# Patient Record
Sex: Male | Born: 1955 | Race: White | Hispanic: No | Smoking: Never smoker
Health system: Southern US, Community
[De-identification: ages and names within clinical notes are randomized; demographics above are authoritative.]

## PROBLEM LIST (undated history)

## (undated) DIAGNOSIS — E7800 Pure hypercholesterolemia, unspecified: Secondary | ICD-10-CM

## (undated) DIAGNOSIS — K5190 Ulcerative colitis, unspecified, without complications: Secondary | ICD-10-CM

## (undated) DIAGNOSIS — R7303 Prediabetes: Secondary | ICD-10-CM

## (undated) DIAGNOSIS — E119 Type 2 diabetes mellitus without complications: Secondary | ICD-10-CM

## (undated) DIAGNOSIS — I10 Essential (primary) hypertension: Secondary | ICD-10-CM

## (undated) HISTORY — DX: Pure hypercholesterolemia, unspecified: E78.00

## (undated) HISTORY — DX: Ulcerative colitis, unspecified, without complications: K51.90

## (undated) HISTORY — PX: MEDIAL PARTIAL KNEE REPLACEMENT: SHX5965

## (undated) HISTORY — DX: Prediabetes: R73.03

---

## 2006-01-30 ENCOUNTER — Ambulatory Visit: Payer: Self-pay | Admitting: Cardiology

## 2006-02-01 ENCOUNTER — Ambulatory Visit: Payer: Self-pay | Admitting: Cardiology

## 2006-02-13 ENCOUNTER — Ambulatory Visit: Payer: Self-pay | Admitting: Cardiology

## 2011-03-23 ENCOUNTER — Ambulatory Visit (HOSPITAL_COMMUNITY)
Admission: RE | Admit: 2011-03-23 | Discharge: 2011-03-23 | Disposition: A | Discharge status: Home / Self Care | Payer: BC Managed Care – PPO | Source: Ambulatory Visit | Attending: Urology | Admitting: Urology

## 2011-03-23 ENCOUNTER — Ambulatory Visit (HOSPITAL_COMMUNITY): Payer: BC Managed Care – PPO

## 2011-03-23 DIAGNOSIS — I10 Essential (primary) hypertension: Secondary | ICD-10-CM | POA: Insufficient documentation

## 2011-03-23 DIAGNOSIS — N201 Calculus of ureter: Secondary | ICD-10-CM | POA: Insufficient documentation

## 2011-03-23 DIAGNOSIS — E7800 Pure hypercholesterolemia, unspecified: Secondary | ICD-10-CM | POA: Insufficient documentation

## 2011-03-23 DIAGNOSIS — Z79899 Other long term (current) drug therapy: Secondary | ICD-10-CM | POA: Insufficient documentation

## 2011-03-23 DIAGNOSIS — R110 Nausea: Secondary | ICD-10-CM | POA: Insufficient documentation

## 2011-03-23 LAB — BASIC METABOLIC PANEL
CO2: 30 mEq/L (ref 19–32)
Chloride: 95 mEq/L — ABNORMAL LOW (ref 96–112)
GFR calc Af Amer: 49 mL/min — ABNORMAL LOW (ref >60)
GFR calc non Af Amer: 40 mL/min — ABNORMAL LOW (ref >60)
Potassium: 4.1 mEq/L (ref 3.5–5.1)
Sodium: 134 mEq/L — ABNORMAL LOW (ref 135–145)

## 2011-03-23 LAB — CBC
Hemoglobin: 13.4 g/dL (ref 13.0–17.0)
MCH: 29 pg (ref 26.0–34.0)
RBC: 4.62 MIL/uL (ref 4.22–5.81)
WBC: 11.2 10*3/uL — ABNORMAL HIGH (ref 4.0–10.5)

## 2011-03-23 LAB — SURGICAL PCR SCREEN: Staphylococcus aureus: NEGATIVE

## 2011-04-12 NOTE — Op Note (Signed)
NAME:  Joseph Chase, LIMB              ACCOUNT NO.:  [id number]  MEDICAL RECORD NO.:  [id number]           PATIENT TYPE:  O  LOCATION:  DAYL                         FACILITY:  [facility]  PHYSICIAN:  Bertram Millard. Jocelyn Nold, M.D.DATE OF BIRTH:  Sep 04, 1956  DATE OF PROCEDURE:  03/23/2011 DATE OF DISCHARGE:                              OPERATIVE REPORT   PREOPERATIVE DIAGNOSIS:  A 12-mm left ureteropelvic junction stone.  POSTOPERATIVE DIAGNOSIS:  A 12-mm left ureteropelvic junction stone.  PROCEDURE:  Cystoscopy, left retrograde ureteral pyelogram, left ureteroscopy, holmium laser fragmentation of left ureteropelvic junction stone, double-J stent placement (24 cm x 6-French contour stent with string)  SURGEON:  Bertram Millard. Elenor Wildes, M.D.  ANESTHESIA:  General with LMA.  COMPLICATIONS:  None.  BRIEF HISTORY:  This is a 55 year old male with recent presentation of left proximal ureteral stone approximately 12 mm in size.  It is radiolucent and lithotripsy is not possible because of this.  Due to the patient's significant pain, he was worked in urgently for ureteroscopy and stone fragmentation.  Risks and complications of the procedure have been discussed with the patient and his wife.  The possibility of double- J stent was also discussed.  He understands these and desires to proceed.  DESCRIPTION OF PROCEDURE:  The patient was identified and marked in the holding area and taken to the operating room after IV antibiotics were administered.  He was administered a general anesthetic using LMA and placed in the dorsal lithotomy position.  Genitalia and perineum were prepped and draped.  Time-out was then performed.  The procedure then commenced.  A 22-French panendoscope was advanced under direct vision through his urethra which was normal.  Prostate was nonobstructive.  Bladder was without lesions.  It was inspected circumferentially.  The left ureteral orifice was cannulated with  an open-ended catheter and retrograde pyelogram was performed.  The retrograde revealed a normal ureter.  There was a filling defect in the proximal ureter which corresponded with the previously mentioned left UPJ stone.  Very little contrast got by this filling defect.  Following retrograde, I advanced the sensor-tip guidewire up the left renal pelvis without knocking the stone up into the kidney.  I then placed the ureteroscope up through the ureter to the stone.  The stone at this point had been dislodged in the renal pelvis.  It was grasped with a nitinol basket and brought back into the proximal ureter and backstop was placed proximal to the stone to prevent further migration up into the pelvis during treatment.  I then fragmented the stone into multiple small fragments quite easily with the with the holmium laser. A large fragment backed up in the kidney which was then treated again with lithotripsy using the holmium laser.  I did not remove any of the fragments, as they were quite small and I thought that they would pass with the assistance of a double-J stent.  There was no ureteral injury seen.  I then removed the ureteroscope and using cystoscopic guidance placed a 6-French x 24 cm contour stent.  The string was left on.  The bladder was drained and the procedure  terminated.  The string was taped to the patient's penis.  The patient was awakened and taken to PACU in stable condition.  He was discharged home on 5 days of Bactrim DS one p.o. b.i.d., Percocet #20, and will follow up with me on the 21st of this month.     Bertram Millard. Fadil Macmaster, M.D.     SMD/MEDQ  D:  03/23/2011  T:  03/23/2011  Job:  [id number]  cc:   Selinda Flavin, MD Fax: [phone]  Electronically Signed by Marcine Matar M.D. on 04/12/2011 12:50:02 PM

## 2011-07-21 IMAGING — CR DG CHEST 2V
2 series · 2 of 2 positions shown · non-contrast
Comparison: None

CLINICAL DATA: Preop left stone.  Hypertension.

CHEST - 2 VIEW

[w chest pa]
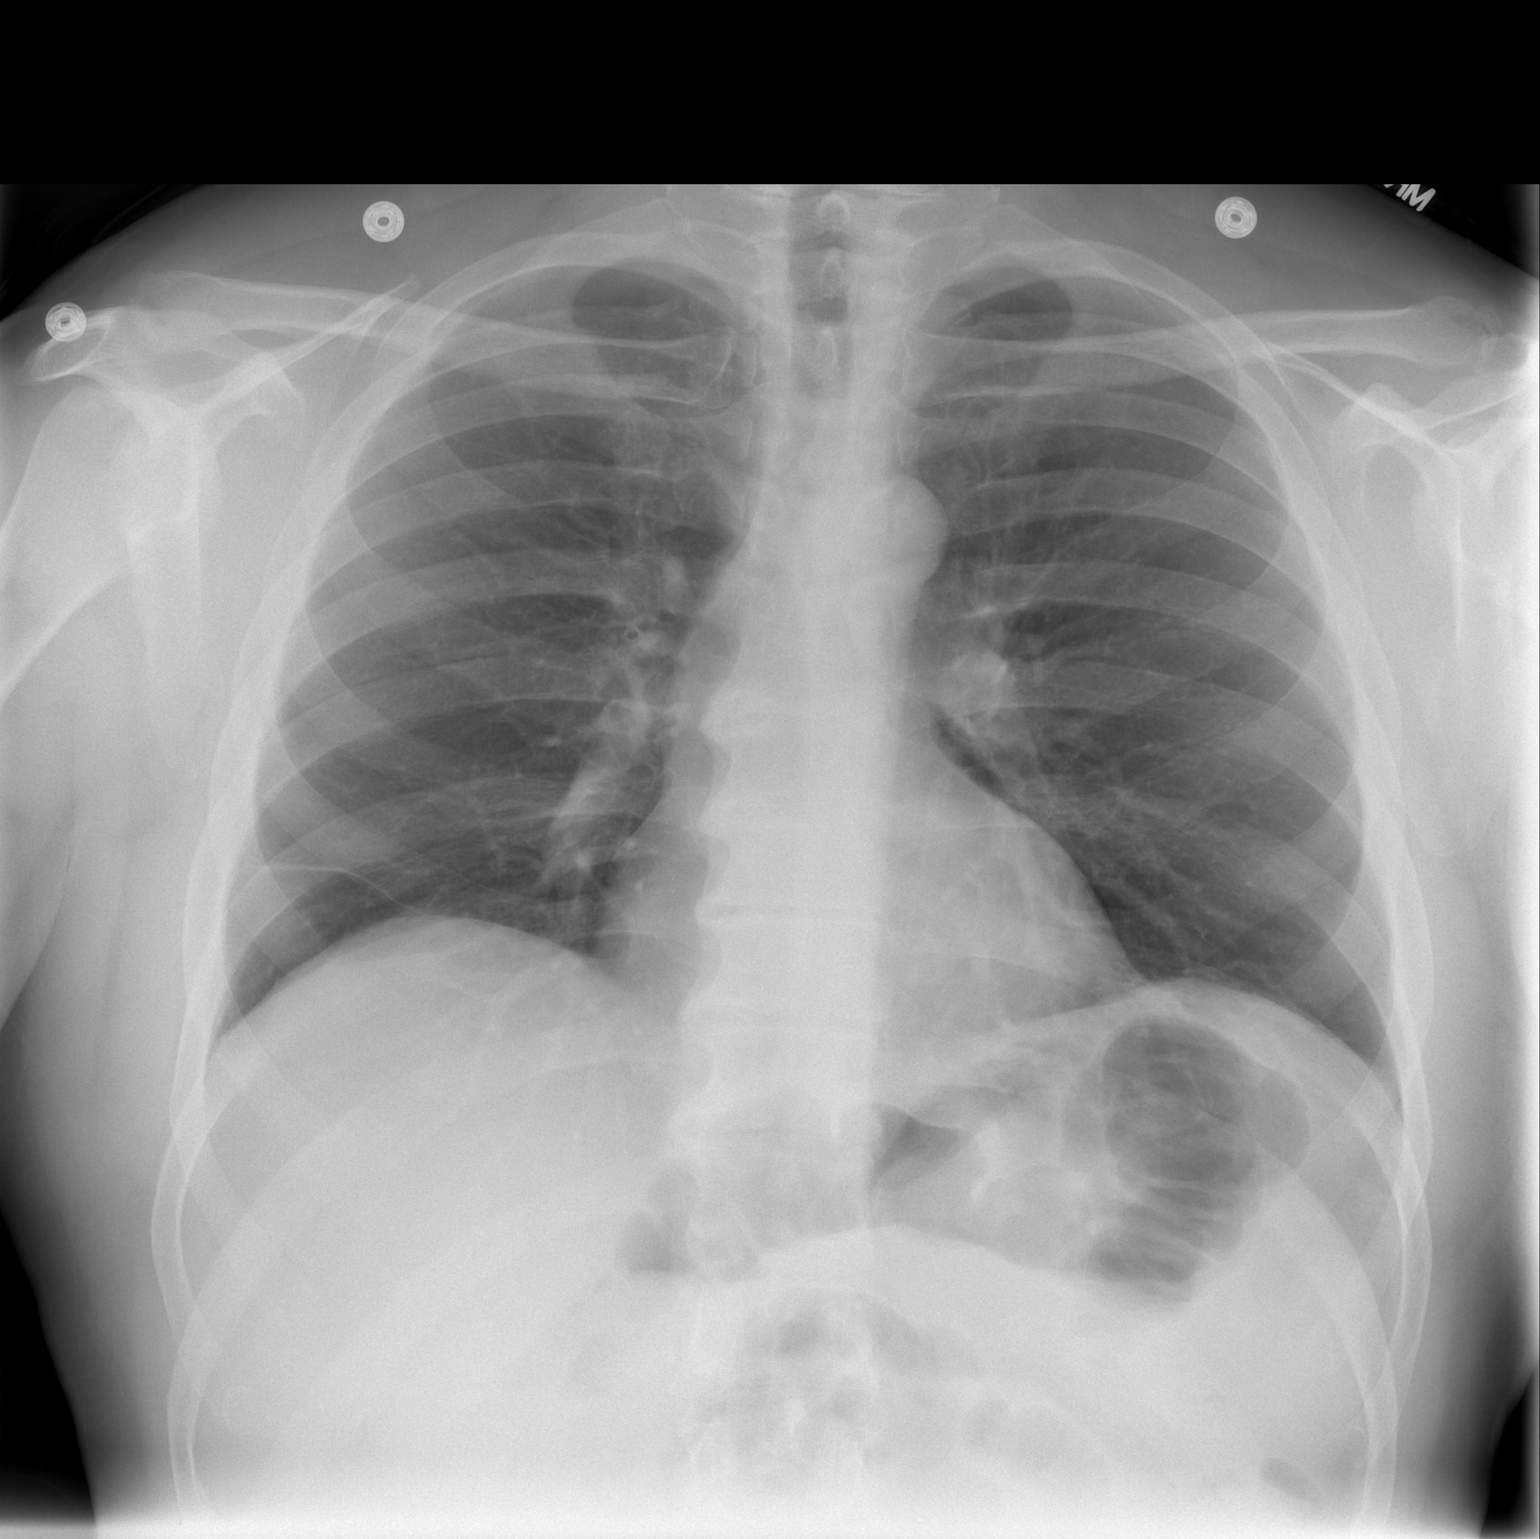

[w chest lat]
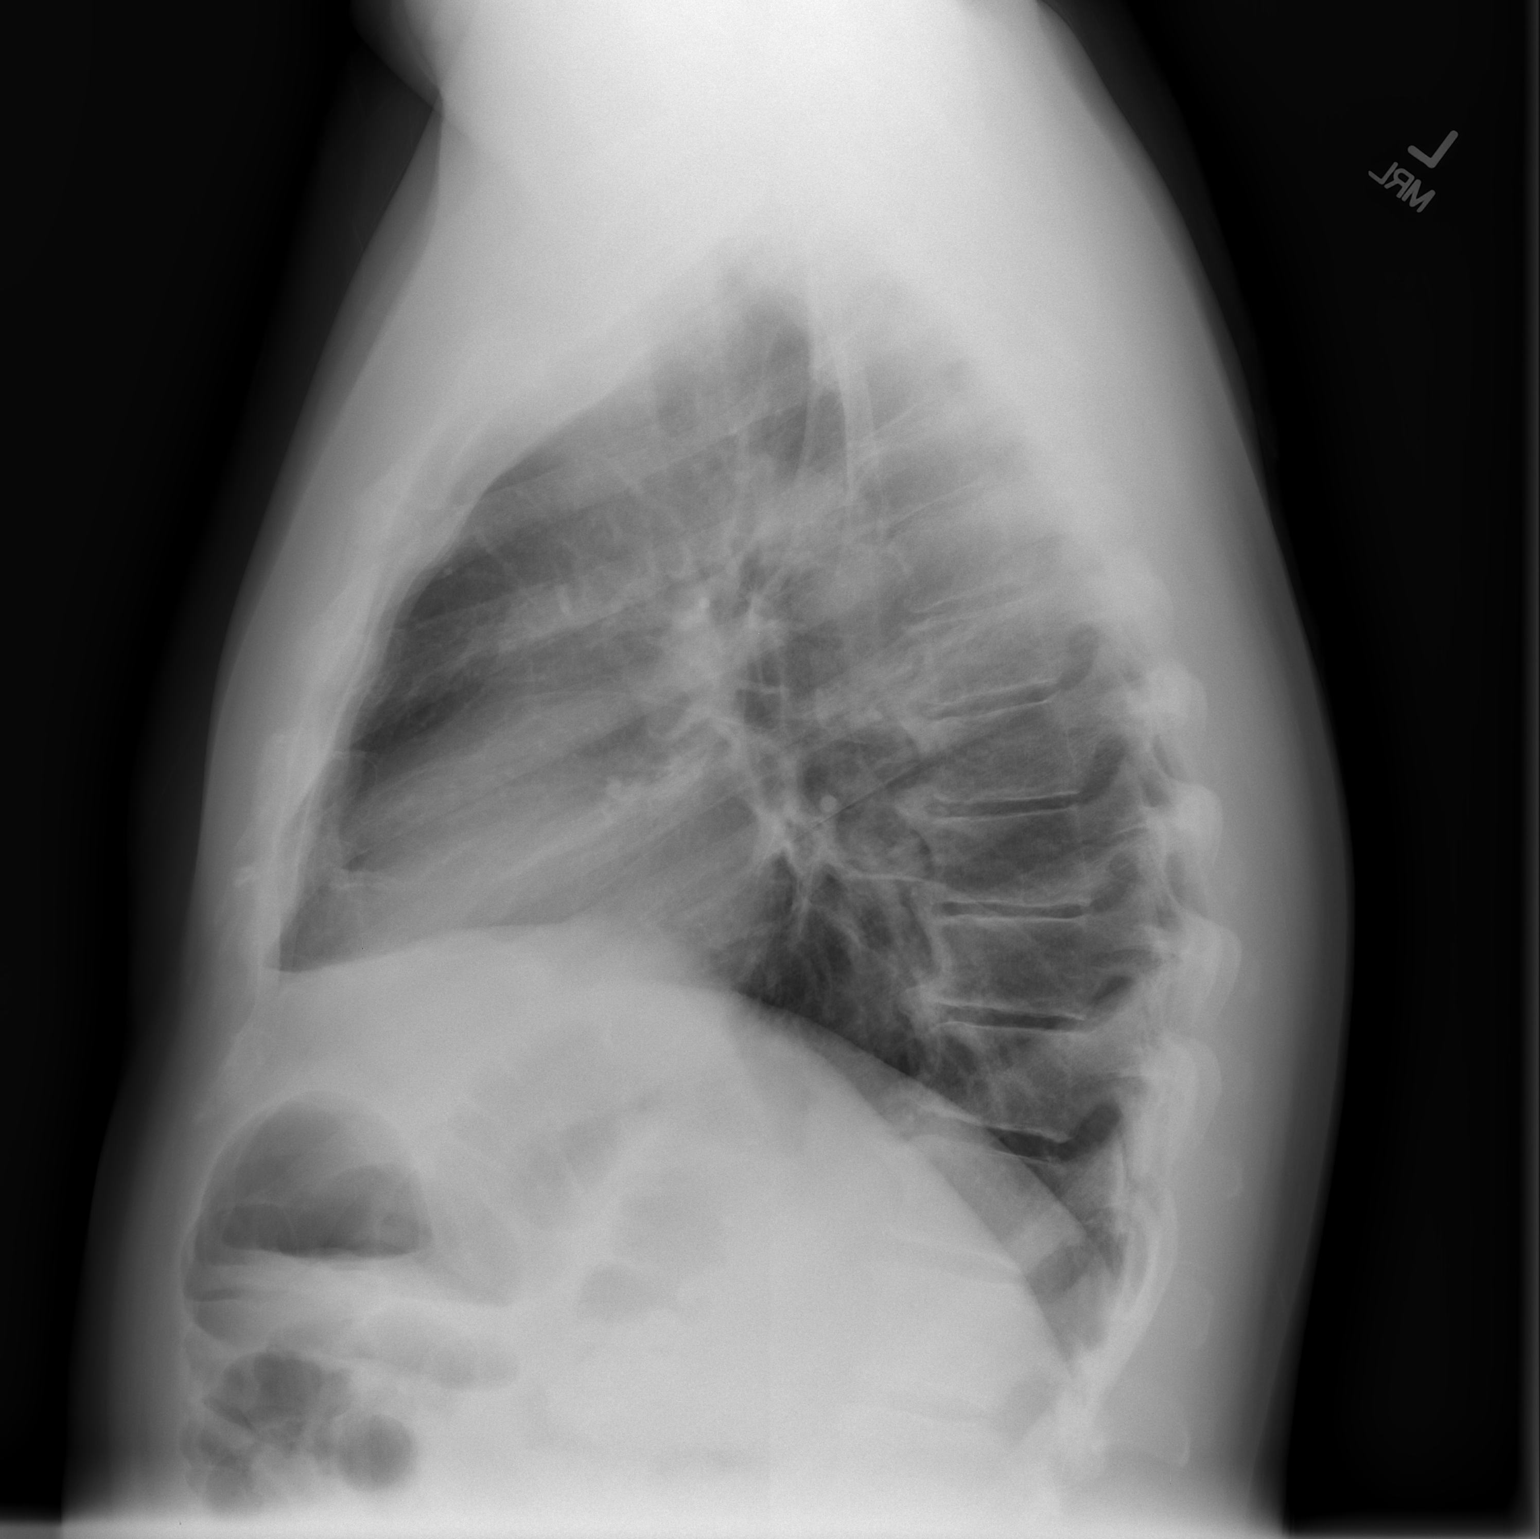

[2 of 2 positions shown; findings below may reference images not displayed]

FINDINGS: Subsegmental atelectasis or scarring in the right lung
base.  Lungs otherwise clear.  Heart is normal size and no
effusions.  Degenerative changes in the thoracic spine.
IMPRESSION: No active disease.  Right basilar scarring or subsegmental
atelectasis.

## 2012-10-01 ENCOUNTER — Ambulatory Visit (INDEPENDENT_AMBULATORY_CARE_PROVIDER_SITE_OTHER): Payer: BC Managed Care – PPO | Admitting: Urology

## 2012-10-01 DIAGNOSIS — N200 Calculus of kidney: Secondary | ICD-10-CM

## 2015-07-24 DIAGNOSIS — Z91030 Bee allergy status: Secondary | ICD-10-CM

## 2015-07-24 DIAGNOSIS — Z91038 Other insect allergy status: Secondary | ICD-10-CM | POA: Insufficient documentation

## 2015-08-25 HISTORY — PX: BREAST MASS EXCISION: SHX1267

## 2015-09-14 ENCOUNTER — Ambulatory Visit (INDEPENDENT_AMBULATORY_CARE_PROVIDER_SITE_OTHER): Payer: 59 | Admitting: Allergy and Immunology

## 2015-09-14 ENCOUNTER — Encounter: Payer: Self-pay | Admitting: Allergy and Immunology

## 2015-09-14 VITALS — BP 134/82 | HR 92 | Temp 98.1°F | Resp 18 | Ht 68.94 in | Wt 191.6 lb

## 2015-09-14 DIAGNOSIS — T6391XD Toxic effect of contact with unspecified venomous animal, accidental (unintentional), subsequent encounter: Secondary | ICD-10-CM

## 2015-09-14 MED ORDER — EPINEPHRINE 0.3 MG/0.3ML IJ SOAJ: 0.3000 mg | Freq: Once | INTRAMUSCULAR | Status: DC

## 2015-09-16 ENCOUNTER — Ambulatory Visit (INDEPENDENT_AMBULATORY_CARE_PROVIDER_SITE_OTHER): Payer: 59

## 2015-09-16 DIAGNOSIS — T63441D Toxic effect of venom of bees, accidental (unintentional), subsequent encounter: Secondary | ICD-10-CM | POA: Diagnosis not present

## 2015-09-19 NOTE — Progress Notes (Signed)
NEW PATIENT NOTE  RE: Bharath Bernstein MRN: [id number] DOB: 11-13-1956 [facility] [place] [address], [place] [address] Date of Office Visit: 09/14/2015  Referring provider: Rory Percy, MD [address] [place], [place] [address]  Subjective:  Nobuo Nunziata is a 59 y.o. male who presents today for Other    HPI:  Hildreth is a 59 year old who has been followed our office with venom allergy, last office visit was 2012.  He is now receiving venom immunotherapy every 8 weeks, tolerating without difficulty and is very pleased.  He reports last staying 2015, a yellowjacket on the hand with mild redness and swelling, but no generalized symptoms.  There also was a wasp sting at side and neck with redness only.  No generalized hives, GI or respiratory symptoms with either sting.  He reports regular time spent at [facility] and is outdoors often and desires to continue allergy injections.  He.  He continues to follow with his other physicians regularly including dermatology with an upcoming visit.  Current medications: 1.  EpiPen as needed. 2.  Allegra 100 mg daily. 3.  Aspirin 81 mg once daily. 4.  Imuran daily. 5.  Neurontin daily. 6.  Glucotrol daily. 7.  Hyzaar daily. 8.  Zocor, Ambien, multivitamin, and tramadol daily.  Medical History: Past Medical History  Diagnosis Date  . Elevated cholesterol   . Borderline diabetes   . Ulcerative colitis [facility])    Surgical History: Past Surgical History  Procedure Laterality Date  . Breast mass excision Right October 12,2016    Per patient, non malignant  . Medial partial knee replacement Right 6 yrs ago   Family History: Family History  Problem Relation Age of Onset  . Alzheimer's disease Mother   . Heart attack Father   . Stroke Father   . Allergic rhinitis Neg Hx   . Angioedema Neg Hx   . Asthma Neg Hx   . Eczema Neg Hx   . Immunodeficiency Neg Hx   . Urticaria  Neg Hx    Social History: Social History  . Marital Status: Married    Spouse Name: N/A  . Number of Children: N/A  . Years of Education: N/A   Occupational History  . Not on file.   Social History Main Topics  . Smoking status: Never Smoker   . Smokeless tobacco: Never Used  . Alcohol Use: Not on file  . Drug Use: Not on file  . Sexual Activity: Not on file   Social History Narrative  .  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     Drug Allergies: Allergies  Allergen Reactions  . Venomil Mixed Vespid [Mixed Vespid Venom]   . Venomil Wasp Venom [Wasp Venom]   . Penicillins Rash    Environmental History: Judge lives in a 61 year old house 26 years wood floors, central air and heat with one dog sleeping on a stuffed mattress non-feather pillow and comforter with secondary smoke exposure.  Review of Systems  Constitutional: Negative for fever, weight loss and malaise/fatigue.  HENT: Positive for congestion. Negative for ear pain, hearing loss, nosebleeds and sore throat.   Eyes: Negative for discharge and redness.  Respiratory: Negative for shortness of breath.        Denies history of bronchitis or pneumonia.  Gastrointestinal: Negative for heartburn, nausea, vomiting, abdominal pain, diarrhea and  constipation.  Genitourinary: Negative.   Musculoskeletal: Negative for myalgias and joint pain.  Skin: Negative.  Negative for itching and rash.  Neurological: Negative.  Negative for dizziness, seizures, weakness and headaches.  Endo/Heme/Allergies: Positive for environmental allergies.       Denies sensitivity .  Latex jewelry, NSAIDs, stinging insects, foods, and cosmetics.    Objective:   Filed Vitals:   09/14/15 0938  BP: 134/82  Pulse: 92  Temp: 98.1 F (36.7 C)  Resp: 18   Physical Exam  Constitutional: He is well-developed, well-nourished, and in no distress.  HENT:   Head: Atraumatic.  Right Ear: Tympanic membrane and ear canal normal.  Left Ear: Tympanic membrane and ear canal normal.  Nose: Mucosal edema present. No rhinorrhea. No epistaxis.  Mouth/Throat: Oropharynx is clear and moist and mucous membranes are normal. No oropharyngeal exudate, posterior oropharyngeal edema or posterior oropharyngeal erythema.  Eyes: Conjunctivae are normal.  Neck: Neck supple.  Cardiovascular: Normal rate, S1 normal and S2 normal.   No murmur heard. Pulmonary/Chest: Effort normal and breath sounds normal. He has no wheezes. He has no rhonchi. He has no rales.  Abdominal: Soft. Bowel sounds are normal.  Lymphadenopathy:    He has no cervical adenopathy.  Neurological: He is alert.  Skin: Skin is warm and intact. No cyanosis. Nails show no clubbing.  Whitish scaling oval area at left elbow    Assessment:  1.  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  Presumed Mild allergic rhinitis. 3.  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  Plan:  1.  [place] will continue with immunotherapy given his consistent outdoor exposures and benefit to him, and patient's request. 2.  Allegra as needed. 3.  Dunbar has a follow-up with dermatologist and review possibilities of steroid cream and further evaluation and definitive diagnosis. 4.  Follow-up one year and up-to-date.  EpiPen given today. Meds ordered this encounter  Medications  . EPINEPHrine (EPIPEN 2-PAK) 0.3 mg/0.3 mL IJ SOAJ injection    Sig: Inject 0.3 mLs (0.3 mg total) into the muscle once.    Dispense:  2 Device    Refill:  1      Tasheem Elms M. Ishmael Holter, MD   cc: Rory Percy, MD

## 2015-11-30 ENCOUNTER — Ambulatory Visit (INDEPENDENT_AMBULATORY_CARE_PROVIDER_SITE_OTHER): Payer: BLUE CROSS/BLUE SHIELD | Admitting: *Deleted

## 2015-11-30 DIAGNOSIS — T63441D Toxic effect of venom of bees, accidental (unintentional), subsequent encounter: Secondary | ICD-10-CM | POA: Diagnosis not present

## 2016-02-15 DIAGNOSIS — Z0000 Encounter for general adult medical examination without abnormal findings: Secondary | ICD-10-CM | POA: Diagnosis not present

## 2016-03-13 DIAGNOSIS — M47816 Spondylosis without myelopathy or radiculopathy, lumbar region: Secondary | ICD-10-CM | POA: Diagnosis not present

## 2016-03-13 DIAGNOSIS — M5136 Other intervertebral disc degeneration, lumbar region: Secondary | ICD-10-CM | POA: Diagnosis not present

## 2016-03-14 ENCOUNTER — Ambulatory Visit (INDEPENDENT_AMBULATORY_CARE_PROVIDER_SITE_OTHER): Payer: BLUE CROSS/BLUE SHIELD

## 2016-03-14 DIAGNOSIS — T63441D Toxic effect of venom of bees, accidental (unintentional), subsequent encounter: Secondary | ICD-10-CM

## 2016-03-20 DIAGNOSIS — D485 Neoplasm of uncertain behavior of skin: Secondary | ICD-10-CM | POA: Diagnosis not present

## 2016-03-20 DIAGNOSIS — Z85828 Personal history of other malignant neoplasm of skin: Secondary | ICD-10-CM | POA: Diagnosis not present

## 2016-03-20 DIAGNOSIS — L570 Actinic keratosis: Secondary | ICD-10-CM | POA: Diagnosis not present

## 2016-03-20 DIAGNOSIS — D044 Carcinoma in situ of skin of scalp and neck: Secondary | ICD-10-CM | POA: Diagnosis not present

## 2016-04-13 DIAGNOSIS — C4442 Squamous cell carcinoma of skin of scalp and neck: Secondary | ICD-10-CM | POA: Diagnosis not present

## 2016-04-13 DIAGNOSIS — L570 Actinic keratosis: Secondary | ICD-10-CM | POA: Diagnosis not present

## 2016-05-09 ENCOUNTER — Ambulatory Visit (INDEPENDENT_AMBULATORY_CARE_PROVIDER_SITE_OTHER): Payer: BLUE CROSS/BLUE SHIELD

## 2016-05-09 DIAGNOSIS — T63441D Toxic effect of venom of bees, accidental (unintentional), subsequent encounter: Secondary | ICD-10-CM | POA: Diagnosis not present

## 2016-05-25 DIAGNOSIS — M4306 Spondylolysis, lumbar region: Secondary | ICD-10-CM | POA: Diagnosis not present

## 2016-05-25 DIAGNOSIS — M47816 Spondylosis without myelopathy or radiculopathy, lumbar region: Secondary | ICD-10-CM | POA: Diagnosis not present

## 2016-06-14 DIAGNOSIS — H40053 Ocular hypertension, bilateral: Secondary | ICD-10-CM | POA: Diagnosis not present

## 2016-07-03 DIAGNOSIS — T63441D Toxic effect of venom of bees, accidental (unintentional), subsequent encounter: Secondary | ICD-10-CM | POA: Diagnosis not present

## 2016-07-04 DIAGNOSIS — T63441D Toxic effect of venom of bees, accidental (unintentional), subsequent encounter: Secondary | ICD-10-CM | POA: Diagnosis not present

## 2016-07-05 ENCOUNTER — Ambulatory Visit (INDEPENDENT_AMBULATORY_CARE_PROVIDER_SITE_OTHER): Payer: BLUE CROSS/BLUE SHIELD | Admitting: *Deleted

## 2016-07-05 DIAGNOSIS — K529 Noninfective gastroenteritis and colitis, unspecified: Secondary | ICD-10-CM | POA: Diagnosis not present

## 2016-07-05 DIAGNOSIS — K5730 Diverticulosis of large intestine without perforation or abscess without bleeding: Secondary | ICD-10-CM | POA: Diagnosis not present

## 2016-07-05 DIAGNOSIS — R17 Unspecified jaundice: Secondary | ICD-10-CM | POA: Diagnosis not present

## 2016-07-05 DIAGNOSIS — T63441D Toxic effect of venom of bees, accidental (unintentional), subsequent encounter: Secondary | ICD-10-CM | POA: Diagnosis not present

## 2016-07-10 DIAGNOSIS — R7989 Other specified abnormal findings of blood chemistry: Secondary | ICD-10-CM | POA: Diagnosis not present

## 2016-07-10 DIAGNOSIS — M5136 Other intervertebral disc degeneration, lumbar region: Secondary | ICD-10-CM | POA: Diagnosis not present

## 2016-07-10 DIAGNOSIS — K529 Noninfective gastroenteritis and colitis, unspecified: Secondary | ICD-10-CM | POA: Diagnosis not present

## 2016-08-08 DIAGNOSIS — K5730 Diverticulosis of large intestine without perforation or abscess without bleeding: Secondary | ICD-10-CM | POA: Diagnosis not present

## 2016-08-08 DIAGNOSIS — K529 Noninfective gastroenteritis and colitis, unspecified: Secondary | ICD-10-CM | POA: Diagnosis not present

## 2016-08-08 DIAGNOSIS — R17 Unspecified jaundice: Secondary | ICD-10-CM | POA: Diagnosis not present

## 2016-09-07 ENCOUNTER — Ambulatory Visit (INDEPENDENT_AMBULATORY_CARE_PROVIDER_SITE_OTHER): Payer: BLUE CROSS/BLUE SHIELD | Admitting: *Deleted

## 2016-09-07 DIAGNOSIS — T63441D Toxic effect of venom of bees, accidental (unintentional), subsequent encounter: Secondary | ICD-10-CM | POA: Diagnosis not present

## 2016-09-26 DIAGNOSIS — R7989 Other specified abnormal findings of blood chemistry: Secondary | ICD-10-CM | POA: Diagnosis not present

## 2016-10-10 DIAGNOSIS — L570 Actinic keratosis: Secondary | ICD-10-CM | POA: Diagnosis not present

## 2016-10-10 DIAGNOSIS — D485 Neoplasm of uncertain behavior of skin: Secondary | ICD-10-CM | POA: Diagnosis not present

## 2016-10-10 DIAGNOSIS — Z85828 Personal history of other malignant neoplasm of skin: Secondary | ICD-10-CM | POA: Diagnosis not present

## 2016-10-17 ENCOUNTER — Ambulatory Visit (INDEPENDENT_AMBULATORY_CARE_PROVIDER_SITE_OTHER): Payer: BLUE CROSS/BLUE SHIELD | Admitting: *Deleted

## 2016-10-17 DIAGNOSIS — T63441D Toxic effect of venom of bees, accidental (unintentional), subsequent encounter: Secondary | ICD-10-CM | POA: Diagnosis not present

## 2016-10-31 DIAGNOSIS — M5136 Other intervertebral disc degeneration, lumbar region: Secondary | ICD-10-CM | POA: Diagnosis not present

## 2016-11-01 DIAGNOSIS — C4442 Squamous cell carcinoma of skin of scalp and neck: Secondary | ICD-10-CM | POA: Diagnosis not present

## 2016-11-02 DIAGNOSIS — L570 Actinic keratosis: Secondary | ICD-10-CM | POA: Diagnosis not present

## 2016-11-21 DIAGNOSIS — M47816 Spondylosis without myelopathy or radiculopathy, lumbar region: Secondary | ICD-10-CM | POA: Diagnosis not present

## 2016-11-21 DIAGNOSIS — M4306 Spondylolysis, lumbar region: Secondary | ICD-10-CM | POA: Diagnosis not present

## 2016-11-22 DIAGNOSIS — N200 Calculus of kidney: Secondary | ICD-10-CM | POA: Diagnosis not present

## 2016-12-15 ENCOUNTER — Ambulatory Visit (INDEPENDENT_AMBULATORY_CARE_PROVIDER_SITE_OTHER): Payer: BLUE CROSS/BLUE SHIELD

## 2016-12-15 DIAGNOSIS — T63441D Toxic effect of venom of bees, accidental (unintentional), subsequent encounter: Secondary | ICD-10-CM | POA: Diagnosis not present

## 2016-12-15 DIAGNOSIS — Z79899 Other long term (current) drug therapy: Secondary | ICD-10-CM | POA: Diagnosis not present

## 2016-12-15 DIAGNOSIS — Z86010 Personal history of colonic polyps: Secondary | ICD-10-CM | POA: Diagnosis not present

## 2016-12-15 DIAGNOSIS — K529 Noninfective gastroenteritis and colitis, unspecified: Secondary | ICD-10-CM | POA: Diagnosis not present

## 2016-12-15 DIAGNOSIS — K5730 Diverticulosis of large intestine without perforation or abscess without bleeding: Secondary | ICD-10-CM | POA: Diagnosis not present

## 2017-01-26 ENCOUNTER — Ambulatory Visit: Payer: Self-pay

## 2017-02-13 ENCOUNTER — Ambulatory Visit (INDEPENDENT_AMBULATORY_CARE_PROVIDER_SITE_OTHER): Payer: BLUE CROSS/BLUE SHIELD | Admitting: *Deleted

## 2017-02-13 DIAGNOSIS — T63441D Toxic effect of venom of bees, accidental (unintentional), subsequent encounter: Secondary | ICD-10-CM | POA: Diagnosis not present

## 2017-02-15 DIAGNOSIS — J209 Acute bronchitis, unspecified: Secondary | ICD-10-CM | POA: Diagnosis not present

## 2017-02-15 DIAGNOSIS — R05 Cough: Secondary | ICD-10-CM | POA: Diagnosis not present

## 2017-02-15 DIAGNOSIS — Z6828 Body mass index (BMI) 28.0-28.9, adult: Secondary | ICD-10-CM | POA: Diagnosis not present

## 2017-02-22 DIAGNOSIS — E1165 Type 2 diabetes mellitus with hyperglycemia: Secondary | ICD-10-CM | POA: Diagnosis not present

## 2017-02-22 DIAGNOSIS — I10 Essential (primary) hypertension: Secondary | ICD-10-CM | POA: Diagnosis not present

## 2017-02-22 DIAGNOSIS — E7800 Pure hypercholesterolemia, unspecified: Secondary | ICD-10-CM | POA: Diagnosis not present

## 2017-02-26 DIAGNOSIS — Z6828 Body mass index (BMI) 28.0-28.9, adult: Secondary | ICD-10-CM | POA: Diagnosis not present

## 2017-02-26 DIAGNOSIS — Z23 Encounter for immunization: Secondary | ICD-10-CM | POA: Diagnosis not present

## 2017-02-26 DIAGNOSIS — Z0000 Encounter for general adult medical examination without abnormal findings: Secondary | ICD-10-CM | POA: Diagnosis not present

## 2017-03-06 DIAGNOSIS — M545 Low back pain: Secondary | ICD-10-CM | POA: Diagnosis not present

## 2017-03-06 DIAGNOSIS — N200 Calculus of kidney: Secondary | ICD-10-CM | POA: Diagnosis not present

## 2017-03-07 DIAGNOSIS — S39012A Strain of muscle, fascia and tendon of lower back, initial encounter: Secondary | ICD-10-CM | POA: Diagnosis not present

## 2017-03-07 DIAGNOSIS — Z6828 Body mass index (BMI) 28.0-28.9, adult: Secondary | ICD-10-CM | POA: Diagnosis not present

## 2017-03-27 ENCOUNTER — Ambulatory Visit (INDEPENDENT_AMBULATORY_CARE_PROVIDER_SITE_OTHER): Payer: BLUE CROSS/BLUE SHIELD | Admitting: *Deleted

## 2017-03-27 DIAGNOSIS — T63441D Toxic effect of venom of bees, accidental (unintentional), subsequent encounter: Secondary | ICD-10-CM

## 2017-04-16 DIAGNOSIS — G2581 Restless legs syndrome: Secondary | ICD-10-CM | POA: Diagnosis not present

## 2017-04-16 DIAGNOSIS — Z1389 Encounter for screening for other disorder: Secondary | ICD-10-CM | POA: Diagnosis not present

## 2017-04-16 DIAGNOSIS — Z6828 Body mass index (BMI) 28.0-28.9, adult: Secondary | ICD-10-CM | POA: Diagnosis not present

## 2017-04-16 DIAGNOSIS — I10 Essential (primary) hypertension: Secondary | ICD-10-CM | POA: Diagnosis not present

## 2017-04-18 DIAGNOSIS — D485 Neoplasm of uncertain behavior of skin: Secondary | ICD-10-CM | POA: Diagnosis not present

## 2017-04-18 DIAGNOSIS — L821 Other seborrheic keratosis: Secondary | ICD-10-CM | POA: Diagnosis not present

## 2017-04-18 DIAGNOSIS — L570 Actinic keratosis: Secondary | ICD-10-CM | POA: Diagnosis not present

## 2017-04-19 DIAGNOSIS — M47817 Spondylosis without myelopathy or radiculopathy, lumbosacral region: Secondary | ICD-10-CM | POA: Diagnosis not present

## 2017-04-19 DIAGNOSIS — M5136 Other intervertebral disc degeneration, lumbar region: Secondary | ICD-10-CM | POA: Diagnosis not present

## 2017-05-24 ENCOUNTER — Ambulatory Visit (INDEPENDENT_AMBULATORY_CARE_PROVIDER_SITE_OTHER): Payer: BLUE CROSS/BLUE SHIELD

## 2017-05-24 DIAGNOSIS — T63441D Toxic effect of venom of bees, accidental (unintentional), subsequent encounter: Secondary | ICD-10-CM

## 2017-05-28 DIAGNOSIS — R400 Somnolence: Secondary | ICD-10-CM | POA: Diagnosis not present

## 2017-05-28 DIAGNOSIS — G4719 Other hypersomnia: Secondary | ICD-10-CM | POA: Diagnosis not present

## 2017-05-28 DIAGNOSIS — G2581 Restless legs syndrome: Secondary | ICD-10-CM | POA: Diagnosis not present

## 2017-05-29 DIAGNOSIS — M47816 Spondylosis without myelopathy or radiculopathy, lumbar region: Secondary | ICD-10-CM | POA: Diagnosis not present

## 2017-05-29 DIAGNOSIS — M4306 Spondylolysis, lumbar region: Secondary | ICD-10-CM | POA: Diagnosis not present

## 2017-06-06 DIAGNOSIS — G4719 Other hypersomnia: Secondary | ICD-10-CM | POA: Diagnosis not present

## 2017-06-06 DIAGNOSIS — G2581 Restless legs syndrome: Secondary | ICD-10-CM | POA: Diagnosis not present

## 2017-06-18 DIAGNOSIS — G2581 Restless legs syndrome: Secondary | ICD-10-CM | POA: Diagnosis not present

## 2017-07-19 ENCOUNTER — Ambulatory Visit: Payer: Self-pay

## 2017-07-24 ENCOUNTER — Ambulatory Visit (INDEPENDENT_AMBULATORY_CARE_PROVIDER_SITE_OTHER): Payer: BLUE CROSS/BLUE SHIELD | Admitting: *Deleted

## 2017-07-24 DIAGNOSIS — T63441D Toxic effect of venom of bees, accidental (unintentional), subsequent encounter: Secondary | ICD-10-CM

## 2017-08-08 DIAGNOSIS — G4733 Obstructive sleep apnea (adult) (pediatric): Secondary | ICD-10-CM | POA: Diagnosis not present

## 2017-08-08 DIAGNOSIS — G4710 Hypersomnia, unspecified: Secondary | ICD-10-CM | POA: Diagnosis not present

## 2017-08-08 DIAGNOSIS — G4761 Periodic limb movement disorder: Secondary | ICD-10-CM | POA: Diagnosis not present

## 2017-08-08 DIAGNOSIS — G4730 Sleep apnea, unspecified: Secondary | ICD-10-CM | POA: Diagnosis not present

## 2017-08-29 DIAGNOSIS — G4761 Periodic limb movement disorder: Secondary | ICD-10-CM | POA: Diagnosis not present

## 2017-08-29 DIAGNOSIS — G4710 Hypersomnia, unspecified: Secondary | ICD-10-CM | POA: Diagnosis not present

## 2017-08-29 DIAGNOSIS — G4730 Sleep apnea, unspecified: Secondary | ICD-10-CM | POA: Diagnosis not present

## 2017-09-11 DIAGNOSIS — G4733 Obstructive sleep apnea (adult) (pediatric): Secondary | ICD-10-CM | POA: Diagnosis not present

## 2017-09-17 DIAGNOSIS — G4710 Hypersomnia, unspecified: Secondary | ICD-10-CM | POA: Diagnosis not present

## 2017-09-17 DIAGNOSIS — G4730 Sleep apnea, unspecified: Secondary | ICD-10-CM | POA: Diagnosis not present

## 2017-10-01 DIAGNOSIS — G4733 Obstructive sleep apnea (adult) (pediatric): Secondary | ICD-10-CM | POA: Diagnosis not present

## 2017-10-25 DIAGNOSIS — M5136 Other intervertebral disc degeneration, lumbar region: Secondary | ICD-10-CM | POA: Diagnosis not present

## 2017-10-25 DIAGNOSIS — M47816 Spondylosis without myelopathy or radiculopathy, lumbar region: Secondary | ICD-10-CM | POA: Diagnosis not present

## 2017-10-29 DIAGNOSIS — G4739 Other sleep apnea: Secondary | ICD-10-CM | POA: Diagnosis not present

## 2017-10-29 DIAGNOSIS — G4700 Insomnia, unspecified: Secondary | ICD-10-CM | POA: Diagnosis not present

## 2017-10-31 DIAGNOSIS — G4733 Obstructive sleep apnea (adult) (pediatric): Secondary | ICD-10-CM | POA: Diagnosis not present

## 2017-11-02 DIAGNOSIS — G4733 Obstructive sleep apnea (adult) (pediatric): Secondary | ICD-10-CM | POA: Diagnosis not present

## 2017-11-09 DIAGNOSIS — D649 Anemia, unspecified: Secondary | ICD-10-CM | POA: Diagnosis not present

## 2017-11-15 ENCOUNTER — Telehealth: Payer: Self-pay | Admitting: Allergy & Immunology

## 2017-11-15 DIAGNOSIS — L570 Actinic keratosis: Secondary | ICD-10-CM | POA: Diagnosis not present

## 2017-11-15 DIAGNOSIS — D229 Melanocytic nevi, unspecified: Secondary | ICD-10-CM | POA: Diagnosis not present

## 2017-11-15 NOTE — Telephone Encounter (Signed)
Informed patient that will have to split his venom dose and he will receive 0.12ml wait 30 minutes then receive the other 0.26ml. Patient understood and agree with plan.

## 2017-11-15 NOTE — Telephone Encounter (Signed)
Patient missed last injection Wants to know when his next venom injection will be Please call to answer any questions Patients last injection was 2 months ago

## 2017-11-19 NOTE — Telephone Encounter (Signed)
Patient did not come in for injection last. Completing this message.

## 2017-11-22 ENCOUNTER — Ambulatory Visit (INDEPENDENT_AMBULATORY_CARE_PROVIDER_SITE_OTHER): Payer: BLUE CROSS/BLUE SHIELD | Admitting: *Deleted

## 2017-11-22 DIAGNOSIS — R945 Abnormal results of liver function studies: Secondary | ICD-10-CM | POA: Diagnosis not present

## 2017-11-22 DIAGNOSIS — K5100 Ulcerative (chronic) pancolitis without complications: Secondary | ICD-10-CM | POA: Diagnosis not present

## 2017-11-22 DIAGNOSIS — T63441D Toxic effect of venom of bees, accidental (unintentional), subsequent encounter: Secondary | ICD-10-CM | POA: Diagnosis not present

## 2017-11-22 DIAGNOSIS — K760 Fatty (change of) liver, not elsewhere classified: Secondary | ICD-10-CM | POA: Diagnosis not present

## 2017-11-27 DIAGNOSIS — Z6828 Body mass index (BMI) 28.0-28.9, adult: Secondary | ICD-10-CM | POA: Diagnosis not present

## 2017-11-27 DIAGNOSIS — M47816 Spondylosis without myelopathy or radiculopathy, lumbar region: Secondary | ICD-10-CM | POA: Diagnosis not present

## 2017-12-01 DIAGNOSIS — G4733 Obstructive sleep apnea (adult) (pediatric): Secondary | ICD-10-CM | POA: Diagnosis not present

## 2018-01-18 ENCOUNTER — Ambulatory Visit (INDEPENDENT_AMBULATORY_CARE_PROVIDER_SITE_OTHER): Payer: BLUE CROSS/BLUE SHIELD

## 2018-01-18 DIAGNOSIS — T63441D Toxic effect of venom of bees, accidental (unintentional), subsequent encounter: Secondary | ICD-10-CM

## 2018-01-30 DIAGNOSIS — Z6828 Body mass index (BMI) 28.0-28.9, adult: Secondary | ICD-10-CM | POA: Diagnosis not present

## 2018-01-30 DIAGNOSIS — J0101 Acute recurrent maxillary sinusitis: Secondary | ICD-10-CM | POA: Diagnosis not present

## 2018-02-05 DIAGNOSIS — D485 Neoplasm of uncertain behavior of skin: Secondary | ICD-10-CM | POA: Diagnosis not present

## 2018-02-05 DIAGNOSIS — L570 Actinic keratosis: Secondary | ICD-10-CM | POA: Diagnosis not present

## 2018-03-03 DIAGNOSIS — G4733 Obstructive sleep apnea (adult) (pediatric): Secondary | ICD-10-CM | POA: Diagnosis not present

## 2018-03-04 DIAGNOSIS — Z0001 Encounter for general adult medical examination with abnormal findings: Secondary | ICD-10-CM | POA: Diagnosis not present

## 2018-03-06 DIAGNOSIS — Z6828 Body mass index (BMI) 28.0-28.9, adult: Secondary | ICD-10-CM | POA: Diagnosis not present

## 2018-03-06 DIAGNOSIS — Z0001 Encounter for general adult medical examination with abnormal findings: Secondary | ICD-10-CM | POA: Diagnosis not present

## 2018-03-14 ENCOUNTER — Ambulatory Visit (INDEPENDENT_AMBULATORY_CARE_PROVIDER_SITE_OTHER): Payer: BLUE CROSS/BLUE SHIELD | Admitting: *Deleted

## 2018-03-14 DIAGNOSIS — T63441D Toxic effect of venom of bees, accidental (unintentional), subsequent encounter: Secondary | ICD-10-CM

## 2018-03-15 ENCOUNTER — Ambulatory Visit: Payer: BLUE CROSS/BLUE SHIELD

## 2018-03-31 DIAGNOSIS — G4733 Obstructive sleep apnea (adult) (pediatric): Secondary | ICD-10-CM | POA: Diagnosis not present

## 2018-04-29 DIAGNOSIS — J0101 Acute recurrent maxillary sinusitis: Secondary | ICD-10-CM | POA: Diagnosis not present

## 2018-04-29 DIAGNOSIS — Z6828 Body mass index (BMI) 28.0-28.9, adult: Secondary | ICD-10-CM | POA: Diagnosis not present

## 2018-04-30 ENCOUNTER — Ambulatory Visit (INDEPENDENT_AMBULATORY_CARE_PROVIDER_SITE_OTHER): Payer: BLUE CROSS/BLUE SHIELD | Admitting: *Deleted

## 2018-04-30 DIAGNOSIS — R945 Abnormal results of liver function studies: Secondary | ICD-10-CM | POA: Diagnosis not present

## 2018-04-30 DIAGNOSIS — K5100 Ulcerative (chronic) pancolitis without complications: Secondary | ICD-10-CM | POA: Diagnosis not present

## 2018-04-30 DIAGNOSIS — T63441D Toxic effect of venom of bees, accidental (unintentional), subsequent encounter: Secondary | ICD-10-CM | POA: Diagnosis not present

## 2018-05-01 DIAGNOSIS — G4733 Obstructive sleep apnea (adult) (pediatric): Secondary | ICD-10-CM | POA: Diagnosis not present

## 2018-05-21 DIAGNOSIS — H40053 Ocular hypertension, bilateral: Secondary | ICD-10-CM | POA: Diagnosis not present

## 2018-05-25 ENCOUNTER — Emergency Department (HOSPITAL_COMMUNITY)
Admission: EM | Admit: 2018-05-25 | Discharge: 2018-05-25 | Discharge status: Absent without leave | Payer: BLUE CROSS/BLUE SHIELD | Source: Home / Self Care

## 2018-05-25 ENCOUNTER — Ambulatory Visit (HOSPITAL_COMMUNITY)
Admission: RE | Admit: 2018-05-25 | Discharge: 2018-05-25 | Disposition: A | Discharge status: Home / Self Care | Payer: BLUE CROSS/BLUE SHIELD | Source: Ambulatory Visit | Attending: Physician Assistant | Admitting: Physician Assistant

## 2018-05-25 ENCOUNTER — Other Ambulatory Visit (HOSPITAL_COMMUNITY): Payer: Self-pay | Admitting: Physician Assistant

## 2018-05-25 DIAGNOSIS — M25512 Pain in left shoulder: Secondary | ICD-10-CM | POA: Insufficient documentation

## 2018-05-25 DIAGNOSIS — S4992XA Unspecified injury of left shoulder and upper arm, initial encounter: Secondary | ICD-10-CM | POA: Diagnosis not present

## 2018-05-25 DIAGNOSIS — R52 Pain, unspecified: Secondary | ICD-10-CM

## 2018-05-25 DIAGNOSIS — W132XXA Fall from, out of or through roof, initial encounter: Secondary | ICD-10-CM | POA: Diagnosis not present

## 2018-05-27 DIAGNOSIS — I10 Essential (primary) hypertension: Secondary | ICD-10-CM | POA: Diagnosis not present

## 2018-05-27 DIAGNOSIS — E1165 Type 2 diabetes mellitus with hyperglycemia: Secondary | ICD-10-CM | POA: Diagnosis not present

## 2018-05-27 DIAGNOSIS — Z6828 Body mass index (BMI) 28.0-28.9, adult: Secondary | ICD-10-CM | POA: Diagnosis not present

## 2018-05-31 DIAGNOSIS — G4733 Obstructive sleep apnea (adult) (pediatric): Secondary | ICD-10-CM | POA: Diagnosis not present

## 2018-06-04 DIAGNOSIS — Z6829 Body mass index (BMI) 29.0-29.9, adult: Secondary | ICD-10-CM | POA: Diagnosis not present

## 2018-06-04 DIAGNOSIS — M47816 Spondylosis without myelopathy or radiculopathy, lumbar region: Secondary | ICD-10-CM | POA: Diagnosis not present

## 2018-06-19 DIAGNOSIS — M7552 Bursitis of left shoulder: Secondary | ICD-10-CM | POA: Diagnosis not present

## 2018-06-19 DIAGNOSIS — M19012 Primary osteoarthritis, left shoulder: Secondary | ICD-10-CM | POA: Diagnosis not present

## 2018-06-19 DIAGNOSIS — S4992XA Unspecified injury of left shoulder and upper arm, initial encounter: Secondary | ICD-10-CM | POA: Diagnosis not present

## 2018-06-20 DIAGNOSIS — M47816 Spondylosis without myelopathy or radiculopathy, lumbar region: Secondary | ICD-10-CM | POA: Diagnosis not present

## 2018-06-20 DIAGNOSIS — M5136 Other intervertebral disc degeneration, lumbar region: Secondary | ICD-10-CM | POA: Diagnosis not present

## 2018-06-26 ENCOUNTER — Ambulatory Visit (INDEPENDENT_AMBULATORY_CARE_PROVIDER_SITE_OTHER): Payer: BLUE CROSS/BLUE SHIELD | Admitting: *Deleted

## 2018-06-26 DIAGNOSIS — T63441D Toxic effect of venom of bees, accidental (unintentional), subsequent encounter: Secondary | ICD-10-CM | POA: Diagnosis not present

## 2018-07-01 DIAGNOSIS — G4733 Obstructive sleep apnea (adult) (pediatric): Secondary | ICD-10-CM | POA: Diagnosis not present

## 2018-07-03 DIAGNOSIS — M19012 Primary osteoarthritis, left shoulder: Secondary | ICD-10-CM | POA: Diagnosis not present

## 2018-07-03 DIAGNOSIS — M7552 Bursitis of left shoulder: Secondary | ICD-10-CM | POA: Diagnosis not present

## 2018-07-31 DIAGNOSIS — S4992XA Unspecified injury of left shoulder and upper arm, initial encounter: Secondary | ICD-10-CM | POA: Diagnosis not present

## 2018-07-31 DIAGNOSIS — S46012D Strain of muscle(s) and tendon(s) of the rotator cuff of left shoulder, subsequent encounter: Secondary | ICD-10-CM | POA: Diagnosis not present

## 2018-08-01 DIAGNOSIS — G4733 Obstructive sleep apnea (adult) (pediatric): Secondary | ICD-10-CM | POA: Diagnosis not present

## 2018-08-14 DIAGNOSIS — M75122 Complete rotator cuff tear or rupture of left shoulder, not specified as traumatic: Secondary | ICD-10-CM | POA: Diagnosis not present

## 2018-08-14 DIAGNOSIS — M25412 Effusion, left shoulder: Secondary | ICD-10-CM | POA: Diagnosis not present

## 2018-08-14 DIAGNOSIS — M19012 Primary osteoarthritis, left shoulder: Secondary | ICD-10-CM | POA: Diagnosis not present

## 2018-08-14 DIAGNOSIS — S43432A Superior glenoid labrum lesion of left shoulder, initial encounter: Secondary | ICD-10-CM | POA: Diagnosis not present

## 2018-08-14 DIAGNOSIS — S46012A Strain of muscle(s) and tendon(s) of the rotator cuff of left shoulder, initial encounter: Secondary | ICD-10-CM | POA: Diagnosis not present

## 2018-08-14 DIAGNOSIS — S46812A Strain of other muscles, fascia and tendons at shoulder and upper arm level, left arm, initial encounter: Secondary | ICD-10-CM | POA: Diagnosis not present

## 2018-08-20 ENCOUNTER — Ambulatory Visit (INDEPENDENT_AMBULATORY_CARE_PROVIDER_SITE_OTHER): Payer: BLUE CROSS/BLUE SHIELD | Admitting: *Deleted

## 2018-08-20 DIAGNOSIS — T63441D Toxic effect of venom of bees, accidental (unintentional), subsequent encounter: Secondary | ICD-10-CM

## 2018-08-31 DIAGNOSIS — G4733 Obstructive sleep apnea (adult) (pediatric): Secondary | ICD-10-CM | POA: Diagnosis not present

## 2018-09-02 DIAGNOSIS — S46012A Strain of muscle(s) and tendon(s) of the rotator cuff of left shoulder, initial encounter: Secondary | ICD-10-CM | POA: Diagnosis not present

## 2018-09-22 IMAGING — DX DG SHOULDER 2+V*L*
3 series · 3 of 3 positions shown · non-contrast
Comparison: None.

CLINICAL DATA: Shoulder pain after fall from ladder.

EXAM:
LEFT SHOULDER - 2+ VIEW

[shoulder grashey]
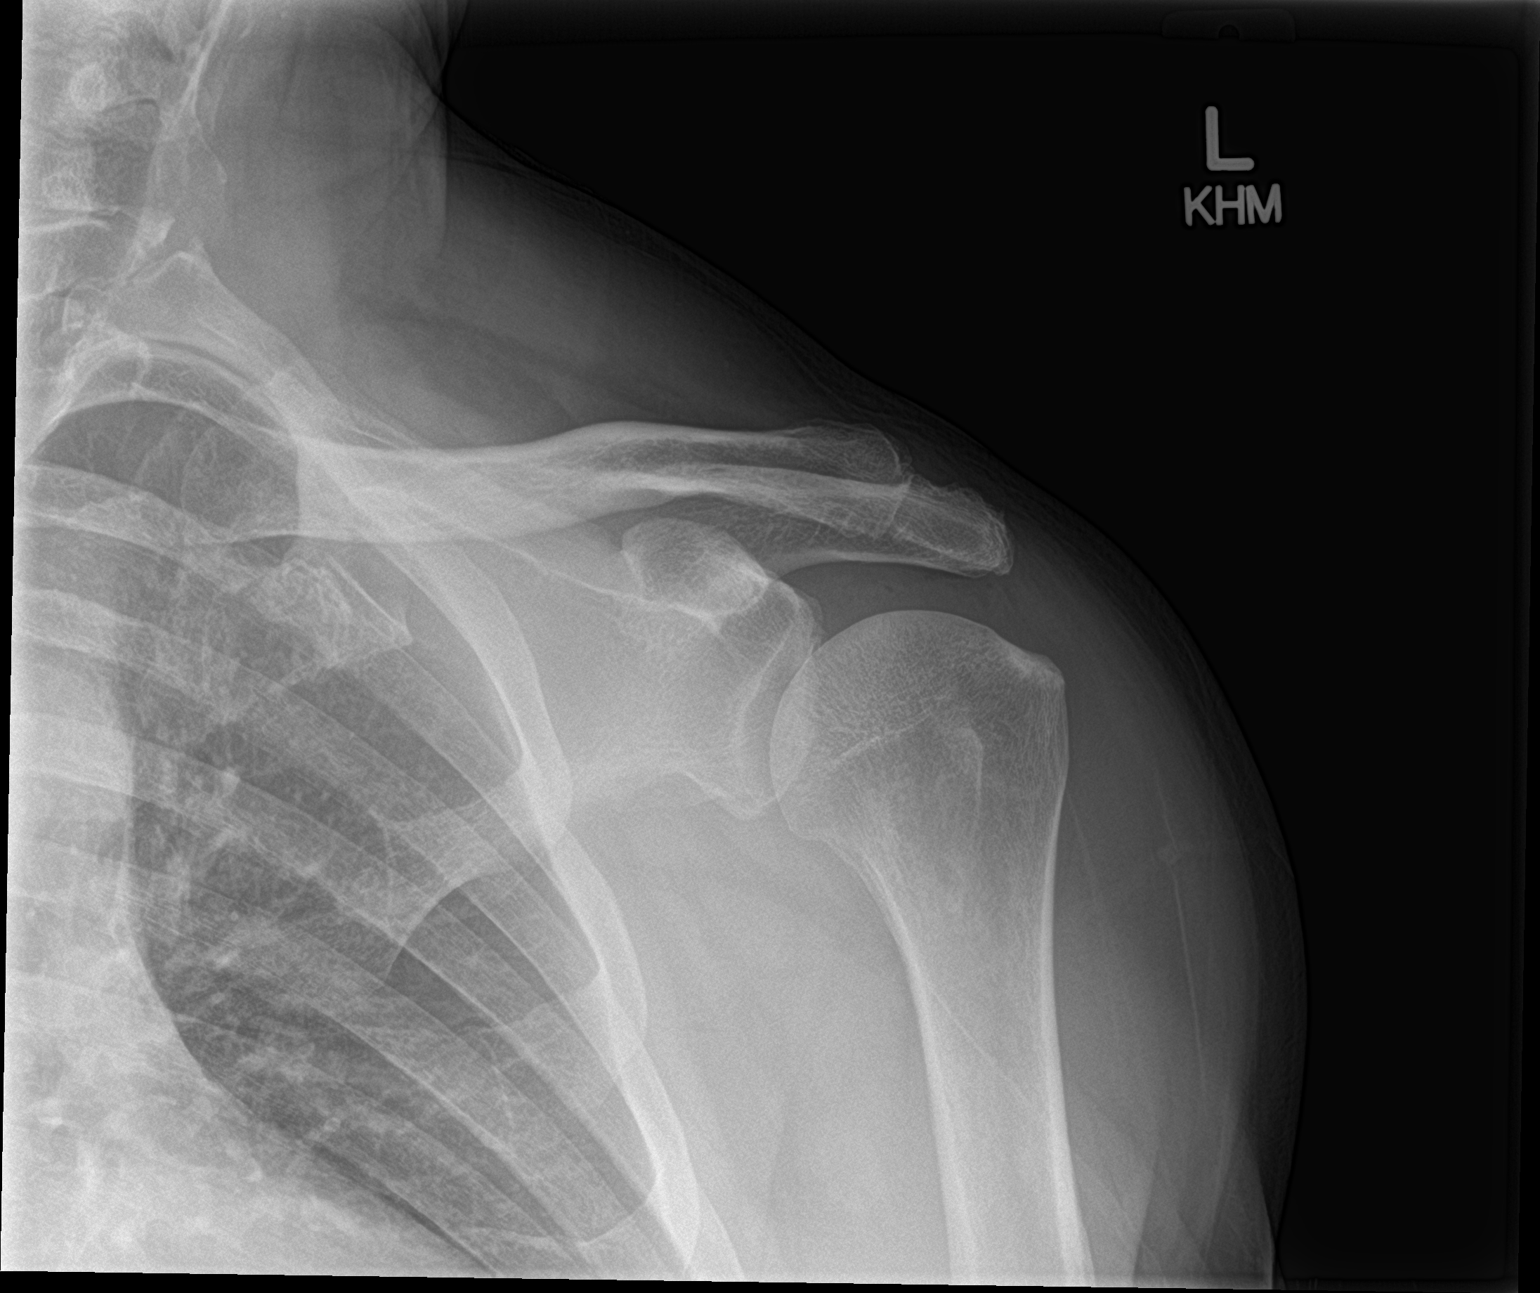

[shoulder y view (1 of 2)]
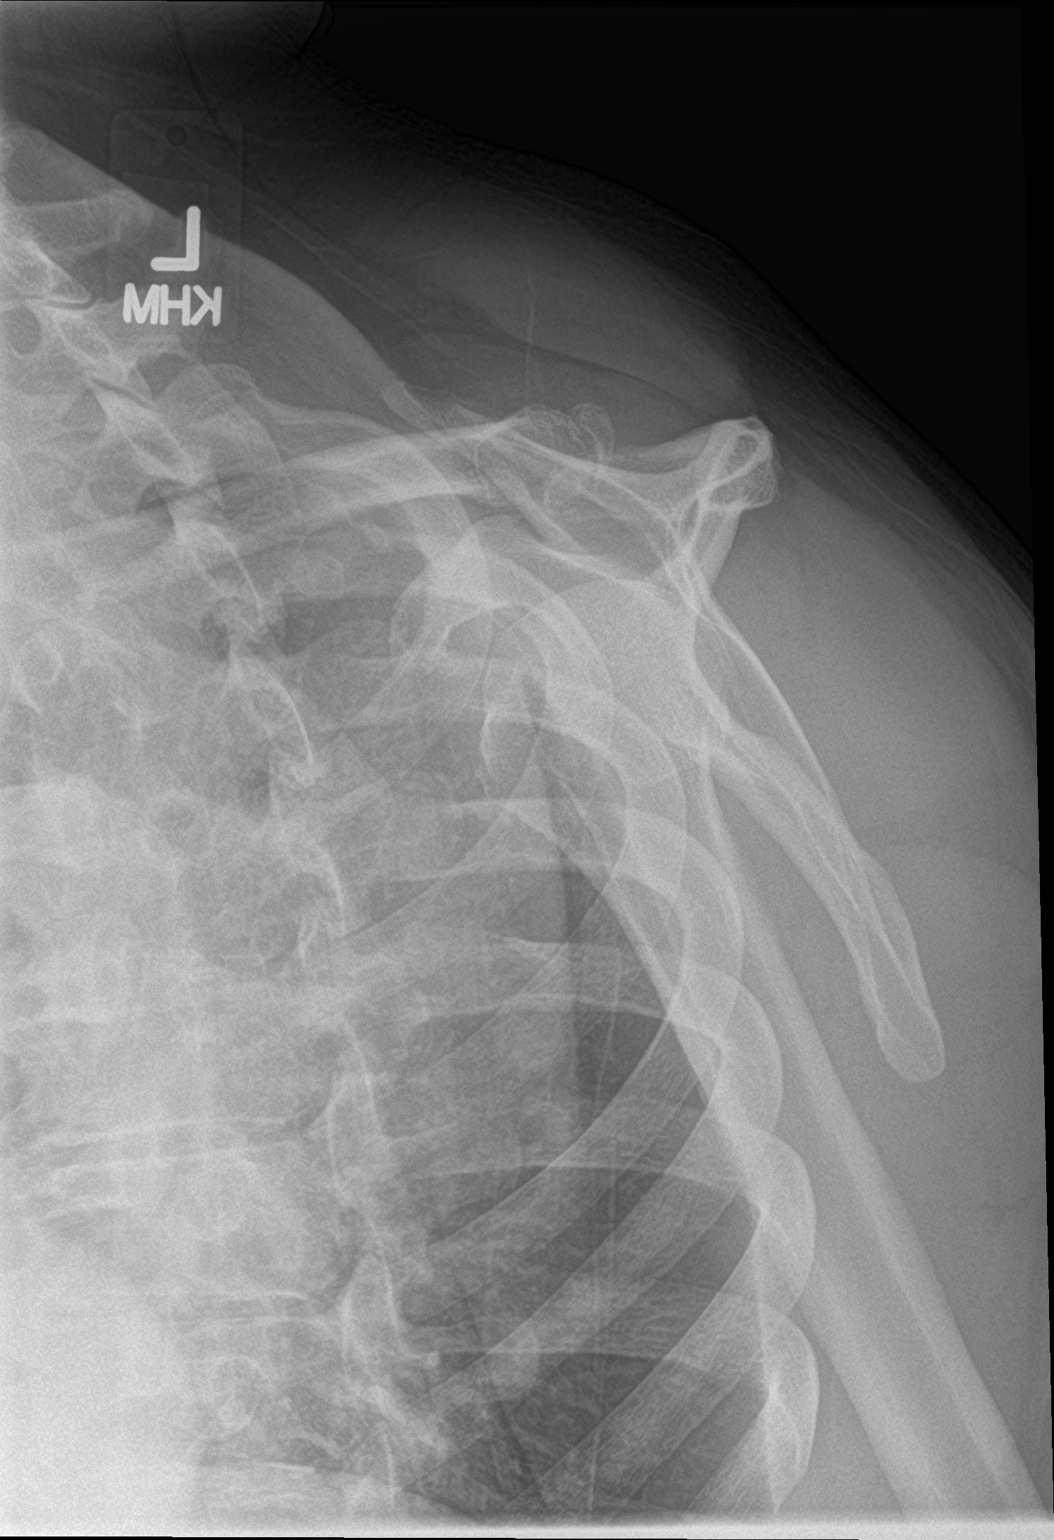

[shoulder y view (2 of 2)]
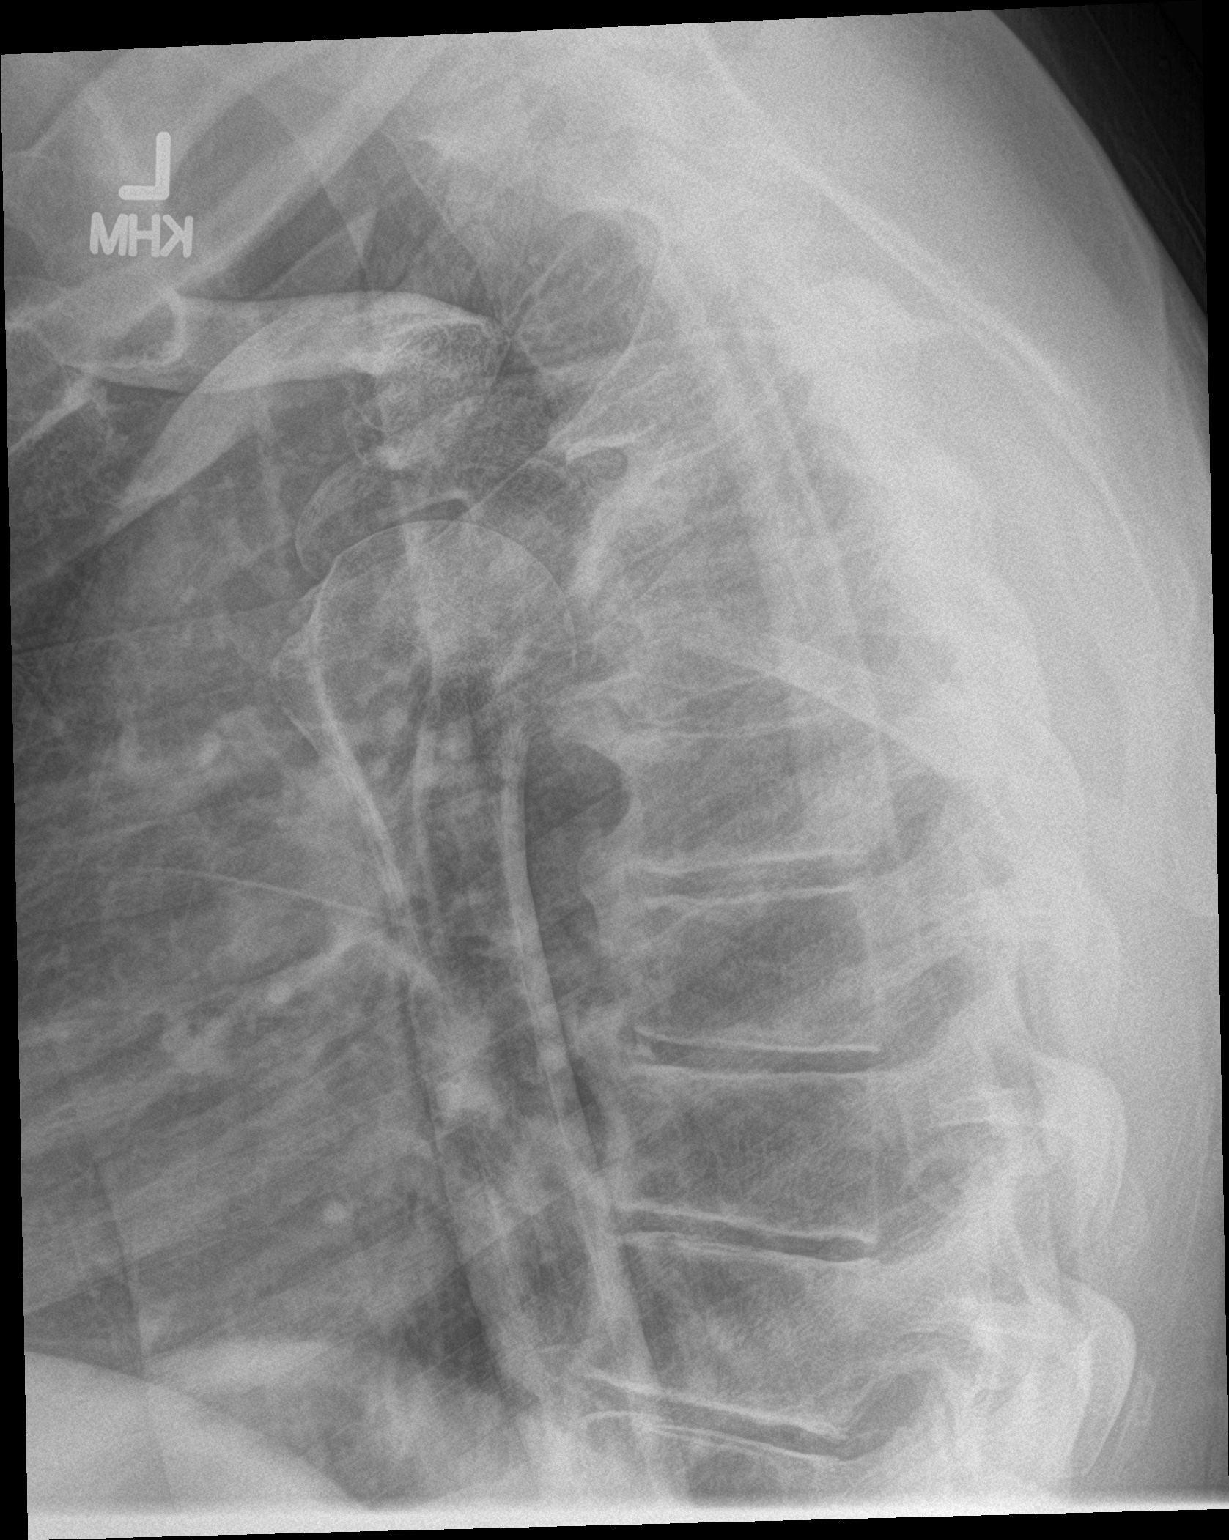

[3 of 3 positions shown; findings below may reference images not displayed]

FINDINGS: There is no evidence of fracture or dislocation. Diffuse idiopathic
skeletal hyperostosis of the included thoracic spine with anterior
flowing osteophytes. There is no evidence of arthropathy or other
focal bone abnormality. Soft tissues are unremarkable.
IMPRESSION: No acute osseous abnormality of the left shoulder.

## 2018-10-01 DIAGNOSIS — G4733 Obstructive sleep apnea (adult) (pediatric): Secondary | ICD-10-CM | POA: Diagnosis not present

## 2018-10-01 DIAGNOSIS — D485 Neoplasm of uncertain behavior of skin: Secondary | ICD-10-CM | POA: Diagnosis not present

## 2018-10-01 DIAGNOSIS — D0439 Carcinoma in situ of skin of other parts of face: Secondary | ICD-10-CM | POA: Diagnosis not present

## 2018-10-01 DIAGNOSIS — L570 Actinic keratosis: Secondary | ICD-10-CM | POA: Diagnosis not present

## 2018-10-01 DIAGNOSIS — D044 Carcinoma in situ of skin of scalp and neck: Secondary | ICD-10-CM | POA: Diagnosis not present

## 2018-10-07 DIAGNOSIS — S46012A Strain of muscle(s) and tendon(s) of the rotator cuff of left shoulder, initial encounter: Secondary | ICD-10-CM | POA: Diagnosis not present

## 2018-10-23 ENCOUNTER — Ambulatory Visit (INDEPENDENT_AMBULATORY_CARE_PROVIDER_SITE_OTHER): Payer: BLUE CROSS/BLUE SHIELD | Admitting: *Deleted

## 2018-10-23 ENCOUNTER — Encounter: Payer: Self-pay | Admitting: Allergy & Immunology

## 2018-10-23 ENCOUNTER — Ambulatory Visit: Payer: BLUE CROSS/BLUE SHIELD | Admitting: Allergy & Immunology

## 2018-10-23 VITALS — BP 132/78 | HR 60 | Resp 18 | Ht 69.0 in | Wt 196.0 lb

## 2018-10-23 DIAGNOSIS — T63441D Toxic effect of venom of bees, accidental (unintentional), subsequent encounter: Secondary | ICD-10-CM

## 2018-10-23 MED ORDER — EPINEPHRINE 0.3 MG/0.3ML IJ SOAJ: 0.3000 mg | Freq: Every day | INTRAMUSCULAR | 3 refills | Status: DC | PRN

## 2018-10-23 NOTE — Patient Instructions (Addendum)
1. Venom hypersensitivity - Continue with venom shots at the same schedule. - We will refill your EpiPen today since you have met your deductible.   2. Return in about 1 year (around 10/24/2019).   Please inform us of any Emergency Department visits, hospitalizations, or changes in symptoms. Call us before going to the ED for breathing or allergy symptoms since we might be able to fit you in for a sick visit. Feel free to contact us anytime with any questions, problems, or concerns.  It was a pleasure to meet you today!  Websites that have reliable patient information: 1. American Academy of Asthma, Allergy, and Immunology: www.aaaai.org 2. Food Allergy Research and Education (FARE): foodallergy.org 3. Mothers of Asthmatics: http://www.asthmacommunitynetwork.org 4. American College of Allergy, Asthma, and Immunology: MonthlyElectricBill.co.uk   Make sure you are registered to vote! If you have moved or changed any of your contact information, you will need to get this updated before voting!

## 2018-10-23 NOTE — Progress Notes (Signed)
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 Venom hypersensitivity - Continue with venom shots at the same schedule. - We will refill your EpiPen today. - I did offer her the option of stopping Mannam immunotherapy, but as he spends a lot of time outside he prefers to maintain the every 8-week dosing to provide some protection against anaphylaxis.  2. Return in about 1 year (around 10/24/2019).  Subjective:   Joseph Chase is a 62 y.o. male presenting today for follow up of  Chief Complaint  Patient presents with  . Hymenoptra Allergy    doing fine. no issues.     Joseph Chase has a history of the following: Patient Active Problem List   Diagnosis Date Noted  . Hymenoptera allergy 07/24/2015    History obtained from: chart review and patient.  Kathleen Lime [facility] Primary Care Provider is Rory Percy, MD.     Gurjot is a 62 y.o. male presenting for a follow up visit.  He was last seen in November 2016 by Dr. Ishmael Holter, who is no longer with the practice.  At that time, he was already spaced to venom immunotherapy every 8 weeks. He was tolerating this well without any adverse event.   Since the last visit, he has done well. He remains on the venom injections every 8 weeks. He has never had any anaphylaxis to the injections. Aside from one sting in 2015, he has not had any at all.  He does spend a lot of time outdoors, and prefers to stay on the injections.  None of his previous allergist have offered cessation of the injections.  Coming every 8 weeks is not a huge concern for him.  He lives in [place] and works in [place].  He also has a history of ulcerative colitis, which is treated with Imuran daily. He diagnosed with this five years ago. He is only on Imuran which  has worked well.  He has never required a colectomy. Otherwise, there have been no changes to his past medical history, surgical history, family history, or social history.    Review of Systems: a 14-point review of systems is pertinent for what is mentioned in HPI.  Otherwise, all other systems were negative.  Constitutional: negative other than that listed in the HPI Eyes: negative other than that listed in the HPI Ears, nose, mouth, throat, and face: negative other than that listed in the HPI Respiratory: negative other than that listed in the HPI Cardiovascular: negative other than that listed in the HPI Gastrointestinal: negative other than that listed in the HPI Genitourinary: negative other than that listed in the HPI Integument: negative other than that listed in the HPI Hematologic: negative other than that listed in the HPI Musculoskeletal: negative other than that listed in the HPI Neurological: negative other than that listed in the HPI Allergy/Immunologic: negative other than that listed in the HPI    Objective:   Blood pressure 132/78, pulse 60, resp. rate 18, height 5\' 9"  (1.753 m), weight 196 lb (88.9 kg), SpO2 98 %. Body mass index is 28.94 kg/m.   Physical Exam:  General: Alert, interactive, in no acute distress. Pleasant male.  Eyes: No conjunctival injection bilaterally, no discharge on the right, no discharge on the left and no Horner-Trantas dots present.  PERRL bilaterally. EOMI without pain. No photophobia.  Ears: Right TM pearly gray with normal light reflex, Left TM pearly gray with normal light reflex, Right TM intact without perforation and Left TM intact without perforation.  Nose/Throat: External nose within normal limits and septum midline. Turbinates minimally edematous without discharge. Posterior oropharynx mildly erythematous without cobblestoning in the posterior oropharynx. Tonsils 2+ without exudates.  Tongue without thrush. Lungs: Clear to  auscultation without wheezing, rhonchi or rales. No increased work of breathing. CV: Normal S1/S2. No murmurs. Capillary refill <2 seconds.  Skin: Warm and dry, without lesions or rashes. Neuro:   Grossly intact. No focal deficits appreciated. Responsive to questions.  Diagnostic studies: none (venom injection given today)       Salvatore Marvel, MD  [facility]

## 2018-10-24 DIAGNOSIS — I10 Essential (primary) hypertension: Secondary | ICD-10-CM | POA: Diagnosis not present

## 2018-10-24 DIAGNOSIS — M7542 Impingement syndrome of left shoulder: Secondary | ICD-10-CM | POA: Diagnosis not present

## 2018-10-24 DIAGNOSIS — E785 Hyperlipidemia, unspecified: Secondary | ICD-10-CM | POA: Diagnosis not present

## 2018-10-24 DIAGNOSIS — Z7982 Long term (current) use of aspirin: Secondary | ICD-10-CM | POA: Diagnosis not present

## 2018-10-24 DIAGNOSIS — Z79899 Other long term (current) drug therapy: Secondary | ICD-10-CM | POA: Diagnosis not present

## 2018-10-24 DIAGNOSIS — M75122 Complete rotator cuff tear or rupture of left shoulder, not specified as traumatic: Secondary | ICD-10-CM | POA: Diagnosis not present

## 2018-10-24 DIAGNOSIS — G4730 Sleep apnea, unspecified: Secondary | ICD-10-CM | POA: Diagnosis not present

## 2018-10-24 DIAGNOSIS — S43432A Superior glenoid labrum lesion of left shoulder, initial encounter: Secondary | ICD-10-CM | POA: Diagnosis not present

## 2018-10-24 DIAGNOSIS — Z91038 Other insect allergy status: Secondary | ICD-10-CM | POA: Diagnosis not present

## 2018-10-24 DIAGNOSIS — Z96651 Presence of right artificial knee joint: Secondary | ICD-10-CM | POA: Diagnosis not present

## 2018-10-24 DIAGNOSIS — M66812 Spontaneous rupture of other tendons, left shoulder: Secondary | ICD-10-CM | POA: Diagnosis not present

## 2018-10-24 DIAGNOSIS — Z7984 Long term (current) use of oral hypoglycemic drugs: Secondary | ICD-10-CM | POA: Diagnosis not present

## 2018-10-24 DIAGNOSIS — Z880 Allergy status to penicillin: Secondary | ICD-10-CM | POA: Diagnosis not present

## 2018-10-24 DIAGNOSIS — G8918 Other acute postprocedural pain: Secondary | ICD-10-CM | POA: Diagnosis not present

## 2018-10-24 DIAGNOSIS — S46212A Strain of muscle, fascia and tendon of other parts of biceps, left arm, initial encounter: Secondary | ICD-10-CM | POA: Diagnosis not present

## 2018-10-24 DIAGNOSIS — M7552 Bursitis of left shoulder: Secondary | ICD-10-CM | POA: Diagnosis not present

## 2018-10-24 DIAGNOSIS — M19012 Primary osteoarthritis, left shoulder: Secondary | ICD-10-CM | POA: Diagnosis not present

## 2018-10-24 DIAGNOSIS — M25512 Pain in left shoulder: Secondary | ICD-10-CM | POA: Diagnosis not present

## 2018-10-24 DIAGNOSIS — E1140 Type 2 diabetes mellitus with diabetic neuropathy, unspecified: Secondary | ICD-10-CM | POA: Diagnosis not present

## 2018-10-24 DIAGNOSIS — M75102 Unspecified rotator cuff tear or rupture of left shoulder, not specified as traumatic: Secondary | ICD-10-CM | POA: Diagnosis not present

## 2018-10-31 DIAGNOSIS — M25612 Stiffness of left shoulder, not elsewhere classified: Secondary | ICD-10-CM | POA: Diagnosis not present

## 2018-10-31 DIAGNOSIS — G4733 Obstructive sleep apnea (adult) (pediatric): Secondary | ICD-10-CM | POA: Diagnosis not present

## 2018-10-31 DIAGNOSIS — M25512 Pain in left shoulder: Secondary | ICD-10-CM | POA: Diagnosis not present

## 2018-10-31 DIAGNOSIS — M25412 Effusion, left shoulder: Secondary | ICD-10-CM | POA: Diagnosis not present

## 2018-10-31 DIAGNOSIS — M6281 Muscle weakness (generalized): Secondary | ICD-10-CM | POA: Diagnosis not present

## 2018-11-01 DIAGNOSIS — M6281 Muscle weakness (generalized): Secondary | ICD-10-CM | POA: Diagnosis not present

## 2018-11-01 DIAGNOSIS — M25412 Effusion, left shoulder: Secondary | ICD-10-CM | POA: Diagnosis not present

## 2018-11-01 DIAGNOSIS — M25612 Stiffness of left shoulder, not elsewhere classified: Secondary | ICD-10-CM | POA: Diagnosis not present

## 2018-11-01 DIAGNOSIS — M25512 Pain in left shoulder: Secondary | ICD-10-CM | POA: Diagnosis not present

## 2018-11-04 DIAGNOSIS — M6281 Muscle weakness (generalized): Secondary | ICD-10-CM | POA: Diagnosis not present

## 2018-11-04 DIAGNOSIS — M25512 Pain in left shoulder: Secondary | ICD-10-CM | POA: Diagnosis not present

## 2018-11-04 DIAGNOSIS — M25412 Effusion, left shoulder: Secondary | ICD-10-CM | POA: Diagnosis not present

## 2018-11-04 DIAGNOSIS — M25612 Stiffness of left shoulder, not elsewhere classified: Secondary | ICD-10-CM | POA: Diagnosis not present

## 2018-11-07 DIAGNOSIS — M25612 Stiffness of left shoulder, not elsewhere classified: Secondary | ICD-10-CM | POA: Diagnosis not present

## 2018-11-07 DIAGNOSIS — M6281 Muscle weakness (generalized): Secondary | ICD-10-CM | POA: Diagnosis not present

## 2018-11-07 DIAGNOSIS — M25412 Effusion, left shoulder: Secondary | ICD-10-CM | POA: Diagnosis not present

## 2018-11-07 DIAGNOSIS — M25512 Pain in left shoulder: Secondary | ICD-10-CM | POA: Diagnosis not present

## 2018-11-08 DIAGNOSIS — M25512 Pain in left shoulder: Secondary | ICD-10-CM | POA: Diagnosis not present

## 2018-11-08 DIAGNOSIS — M6281 Muscle weakness (generalized): Secondary | ICD-10-CM | POA: Diagnosis not present

## 2018-11-08 DIAGNOSIS — M25612 Stiffness of left shoulder, not elsewhere classified: Secondary | ICD-10-CM | POA: Diagnosis not present

## 2018-11-08 DIAGNOSIS — M25412 Effusion, left shoulder: Secondary | ICD-10-CM | POA: Diagnosis not present

## 2018-11-11 DIAGNOSIS — Z9889 Other specified postprocedural states: Secondary | ICD-10-CM | POA: Diagnosis not present

## 2018-11-11 DIAGNOSIS — R52 Pain, unspecified: Secondary | ICD-10-CM | POA: Diagnosis not present

## 2018-11-12 DIAGNOSIS — M25512 Pain in left shoulder: Secondary | ICD-10-CM | POA: Diagnosis not present

## 2018-11-12 DIAGNOSIS — M25612 Stiffness of left shoulder, not elsewhere classified: Secondary | ICD-10-CM | POA: Diagnosis not present

## 2018-11-12 DIAGNOSIS — M25412 Effusion, left shoulder: Secondary | ICD-10-CM | POA: Diagnosis not present

## 2018-11-12 DIAGNOSIS — M6281 Muscle weakness (generalized): Secondary | ICD-10-CM | POA: Diagnosis not present

## 2018-11-18 DIAGNOSIS — M6281 Muscle weakness (generalized): Secondary | ICD-10-CM | POA: Diagnosis not present

## 2018-11-18 DIAGNOSIS — M25412 Effusion, left shoulder: Secondary | ICD-10-CM | POA: Diagnosis not present

## 2018-11-18 DIAGNOSIS — M25612 Stiffness of left shoulder, not elsewhere classified: Secondary | ICD-10-CM | POA: Diagnosis not present

## 2018-11-18 DIAGNOSIS — M25512 Pain in left shoulder: Secondary | ICD-10-CM | POA: Diagnosis not present

## 2018-11-26 DIAGNOSIS — M25412 Effusion, left shoulder: Secondary | ICD-10-CM | POA: Diagnosis not present

## 2018-11-26 DIAGNOSIS — M25612 Stiffness of left shoulder, not elsewhere classified: Secondary | ICD-10-CM | POA: Diagnosis not present

## 2018-11-26 DIAGNOSIS — M6281 Muscle weakness (generalized): Secondary | ICD-10-CM | POA: Diagnosis not present

## 2018-11-26 DIAGNOSIS — M25512 Pain in left shoulder: Secondary | ICD-10-CM | POA: Diagnosis not present

## 2018-12-03 DIAGNOSIS — M25412 Effusion, left shoulder: Secondary | ICD-10-CM | POA: Diagnosis not present

## 2018-12-03 DIAGNOSIS — M6281 Muscle weakness (generalized): Secondary | ICD-10-CM | POA: Diagnosis not present

## 2018-12-03 DIAGNOSIS — M25612 Stiffness of left shoulder, not elsewhere classified: Secondary | ICD-10-CM | POA: Diagnosis not present

## 2018-12-03 DIAGNOSIS — Z6829 Body mass index (BMI) 29.0-29.9, adult: Secondary | ICD-10-CM | POA: Diagnosis not present

## 2018-12-03 DIAGNOSIS — M25512 Pain in left shoulder: Secondary | ICD-10-CM | POA: Diagnosis not present

## 2018-12-03 DIAGNOSIS — M47816 Spondylosis without myelopathy or radiculopathy, lumbar region: Secondary | ICD-10-CM | POA: Diagnosis not present

## 2018-12-09 DIAGNOSIS — M79671 Pain in right foot: Secondary | ICD-10-CM | POA: Diagnosis not present

## 2018-12-09 DIAGNOSIS — M2021 Hallux rigidus, right foot: Secondary | ICD-10-CM | POA: Diagnosis not present

## 2018-12-10 DIAGNOSIS — M25412 Effusion, left shoulder: Secondary | ICD-10-CM | POA: Diagnosis not present

## 2018-12-10 DIAGNOSIS — M6281 Muscle weakness (generalized): Secondary | ICD-10-CM | POA: Diagnosis not present

## 2018-12-10 DIAGNOSIS — M25512 Pain in left shoulder: Secondary | ICD-10-CM | POA: Diagnosis not present

## 2018-12-10 DIAGNOSIS — M25612 Stiffness of left shoulder, not elsewhere classified: Secondary | ICD-10-CM | POA: Diagnosis not present

## 2018-12-11 DIAGNOSIS — N200 Calculus of kidney: Secondary | ICD-10-CM | POA: Diagnosis not present

## 2018-12-11 DIAGNOSIS — K5100 Ulcerative (chronic) pancolitis without complications: Secondary | ICD-10-CM | POA: Diagnosis not present

## 2018-12-11 DIAGNOSIS — R945 Abnormal results of liver function studies: Secondary | ICD-10-CM | POA: Diagnosis not present

## 2018-12-12 DIAGNOSIS — M25412 Effusion, left shoulder: Secondary | ICD-10-CM | POA: Diagnosis not present

## 2018-12-12 DIAGNOSIS — M25512 Pain in left shoulder: Secondary | ICD-10-CM | POA: Diagnosis not present

## 2018-12-12 DIAGNOSIS — M6281 Muscle weakness (generalized): Secondary | ICD-10-CM | POA: Diagnosis not present

## 2018-12-12 DIAGNOSIS — M25612 Stiffness of left shoulder, not elsewhere classified: Secondary | ICD-10-CM | POA: Diagnosis not present

## 2018-12-13 DIAGNOSIS — R945 Abnormal results of liver function studies: Secondary | ICD-10-CM | POA: Diagnosis not present

## 2018-12-13 DIAGNOSIS — K5100 Ulcerative (chronic) pancolitis without complications: Secondary | ICD-10-CM | POA: Diagnosis not present

## 2018-12-16 DIAGNOSIS — M25612 Stiffness of left shoulder, not elsewhere classified: Secondary | ICD-10-CM | POA: Diagnosis not present

## 2018-12-16 DIAGNOSIS — M25412 Effusion, left shoulder: Secondary | ICD-10-CM | POA: Diagnosis not present

## 2018-12-16 DIAGNOSIS — M6281 Muscle weakness (generalized): Secondary | ICD-10-CM | POA: Diagnosis not present

## 2018-12-16 DIAGNOSIS — M25512 Pain in left shoulder: Secondary | ICD-10-CM | POA: Diagnosis not present

## 2018-12-18 ENCOUNTER — Ambulatory Visit (INDEPENDENT_AMBULATORY_CARE_PROVIDER_SITE_OTHER): Payer: BLUE CROSS/BLUE SHIELD

## 2018-12-18 DIAGNOSIS — M6281 Muscle weakness (generalized): Secondary | ICD-10-CM | POA: Diagnosis not present

## 2018-12-18 DIAGNOSIS — T63441D Toxic effect of venom of bees, accidental (unintentional), subsequent encounter: Secondary | ICD-10-CM

## 2018-12-18 DIAGNOSIS — G4733 Obstructive sleep apnea (adult) (pediatric): Secondary | ICD-10-CM | POA: Diagnosis not present

## 2018-12-18 DIAGNOSIS — M25612 Stiffness of left shoulder, not elsewhere classified: Secondary | ICD-10-CM | POA: Diagnosis not present

## 2018-12-18 DIAGNOSIS — M25512 Pain in left shoulder: Secondary | ICD-10-CM | POA: Diagnosis not present

## 2018-12-18 DIAGNOSIS — M25412 Effusion, left shoulder: Secondary | ICD-10-CM | POA: Diagnosis not present

## 2018-12-26 DIAGNOSIS — M25612 Stiffness of left shoulder, not elsewhere classified: Secondary | ICD-10-CM | POA: Diagnosis not present

## 2018-12-26 DIAGNOSIS — M6281 Muscle weakness (generalized): Secondary | ICD-10-CM | POA: Diagnosis not present

## 2018-12-26 DIAGNOSIS — M25512 Pain in left shoulder: Secondary | ICD-10-CM | POA: Diagnosis not present

## 2018-12-26 DIAGNOSIS — M25412 Effusion, left shoulder: Secondary | ICD-10-CM | POA: Diagnosis not present

## 2019-01-01 DIAGNOSIS — M25612 Stiffness of left shoulder, not elsewhere classified: Secondary | ICD-10-CM | POA: Diagnosis not present

## 2019-01-01 DIAGNOSIS — C4442 Squamous cell carcinoma of skin of scalp and neck: Secondary | ICD-10-CM | POA: Diagnosis not present

## 2019-01-01 DIAGNOSIS — M6281 Muscle weakness (generalized): Secondary | ICD-10-CM | POA: Diagnosis not present

## 2019-01-01 DIAGNOSIS — M25512 Pain in left shoulder: Secondary | ICD-10-CM | POA: Diagnosis not present

## 2019-01-01 DIAGNOSIS — M25412 Effusion, left shoulder: Secondary | ICD-10-CM | POA: Diagnosis not present

## 2019-01-02 DIAGNOSIS — M25412 Effusion, left shoulder: Secondary | ICD-10-CM | POA: Diagnosis not present

## 2019-01-02 DIAGNOSIS — M6281 Muscle weakness (generalized): Secondary | ICD-10-CM | POA: Diagnosis not present

## 2019-01-02 DIAGNOSIS — M25612 Stiffness of left shoulder, not elsewhere classified: Secondary | ICD-10-CM | POA: Diagnosis not present

## 2019-01-02 DIAGNOSIS — M25512 Pain in left shoulder: Secondary | ICD-10-CM | POA: Diagnosis not present

## 2019-01-07 DIAGNOSIS — M25512 Pain in left shoulder: Secondary | ICD-10-CM | POA: Diagnosis not present

## 2019-01-07 DIAGNOSIS — M25412 Effusion, left shoulder: Secondary | ICD-10-CM | POA: Diagnosis not present

## 2019-01-07 DIAGNOSIS — M25612 Stiffness of left shoulder, not elsewhere classified: Secondary | ICD-10-CM | POA: Diagnosis not present

## 2019-01-07 DIAGNOSIS — M6281 Muscle weakness (generalized): Secondary | ICD-10-CM | POA: Diagnosis not present

## 2019-01-09 DIAGNOSIS — M25412 Effusion, left shoulder: Secondary | ICD-10-CM | POA: Diagnosis not present

## 2019-01-09 DIAGNOSIS — M25512 Pain in left shoulder: Secondary | ICD-10-CM | POA: Diagnosis not present

## 2019-01-09 DIAGNOSIS — M25612 Stiffness of left shoulder, not elsewhere classified: Secondary | ICD-10-CM | POA: Diagnosis not present

## 2019-01-09 DIAGNOSIS — M6281 Muscle weakness (generalized): Secondary | ICD-10-CM | POA: Diagnosis not present

## 2019-01-13 DIAGNOSIS — M25612 Stiffness of left shoulder, not elsewhere classified: Secondary | ICD-10-CM | POA: Diagnosis not present

## 2019-01-13 DIAGNOSIS — M25512 Pain in left shoulder: Secondary | ICD-10-CM | POA: Diagnosis not present

## 2019-01-13 DIAGNOSIS — M25412 Effusion, left shoulder: Secondary | ICD-10-CM | POA: Diagnosis not present

## 2019-01-13 DIAGNOSIS — M6281 Muscle weakness (generalized): Secondary | ICD-10-CM | POA: Diagnosis not present

## 2019-01-15 DIAGNOSIS — M25612 Stiffness of left shoulder, not elsewhere classified: Secondary | ICD-10-CM | POA: Diagnosis not present

## 2019-01-15 DIAGNOSIS — M6281 Muscle weakness (generalized): Secondary | ICD-10-CM | POA: Diagnosis not present

## 2019-01-15 DIAGNOSIS — M25412 Effusion, left shoulder: Secondary | ICD-10-CM | POA: Diagnosis not present

## 2019-01-15 DIAGNOSIS — M25512 Pain in left shoulder: Secondary | ICD-10-CM | POA: Diagnosis not present

## 2019-01-21 DIAGNOSIS — M25412 Effusion, left shoulder: Secondary | ICD-10-CM | POA: Diagnosis not present

## 2019-01-21 DIAGNOSIS — M25612 Stiffness of left shoulder, not elsewhere classified: Secondary | ICD-10-CM | POA: Diagnosis not present

## 2019-01-21 DIAGNOSIS — M25512 Pain in left shoulder: Secondary | ICD-10-CM | POA: Diagnosis not present

## 2019-01-21 DIAGNOSIS — M6281 Muscle weakness (generalized): Secondary | ICD-10-CM | POA: Diagnosis not present

## 2019-01-29 DIAGNOSIS — M25512 Pain in left shoulder: Secondary | ICD-10-CM | POA: Diagnosis not present

## 2019-01-29 DIAGNOSIS — M25412 Effusion, left shoulder: Secondary | ICD-10-CM | POA: Diagnosis not present

## 2019-01-29 DIAGNOSIS — M6281 Muscle weakness (generalized): Secondary | ICD-10-CM | POA: Diagnosis not present

## 2019-01-29 DIAGNOSIS — M25612 Stiffness of left shoulder, not elsewhere classified: Secondary | ICD-10-CM | POA: Diagnosis not present

## 2019-01-31 DIAGNOSIS — M25412 Effusion, left shoulder: Secondary | ICD-10-CM | POA: Diagnosis not present

## 2019-01-31 DIAGNOSIS — M25512 Pain in left shoulder: Secondary | ICD-10-CM | POA: Diagnosis not present

## 2019-01-31 DIAGNOSIS — M6281 Muscle weakness (generalized): Secondary | ICD-10-CM | POA: Diagnosis not present

## 2019-01-31 DIAGNOSIS — M25612 Stiffness of left shoulder, not elsewhere classified: Secondary | ICD-10-CM | POA: Diagnosis not present

## 2019-02-06 DIAGNOSIS — M25512 Pain in left shoulder: Secondary | ICD-10-CM | POA: Diagnosis not present

## 2019-02-06 DIAGNOSIS — M6281 Muscle weakness (generalized): Secondary | ICD-10-CM | POA: Diagnosis not present

## 2019-02-06 DIAGNOSIS — M25412 Effusion, left shoulder: Secondary | ICD-10-CM | POA: Diagnosis not present

## 2019-02-06 DIAGNOSIS — M25612 Stiffness of left shoulder, not elsewhere classified: Secondary | ICD-10-CM | POA: Diagnosis not present

## 2019-02-11 DIAGNOSIS — M25612 Stiffness of left shoulder, not elsewhere classified: Secondary | ICD-10-CM | POA: Diagnosis not present

## 2019-02-11 DIAGNOSIS — M6281 Muscle weakness (generalized): Secondary | ICD-10-CM | POA: Diagnosis not present

## 2019-02-11 DIAGNOSIS — M25412 Effusion, left shoulder: Secondary | ICD-10-CM | POA: Diagnosis not present

## 2019-02-11 DIAGNOSIS — M25512 Pain in left shoulder: Secondary | ICD-10-CM | POA: Diagnosis not present

## 2019-02-12 ENCOUNTER — Ambulatory Visit (INDEPENDENT_AMBULATORY_CARE_PROVIDER_SITE_OTHER): Payer: BLUE CROSS/BLUE SHIELD

## 2019-02-12 ENCOUNTER — Other Ambulatory Visit: Payer: Self-pay

## 2019-02-12 DIAGNOSIS — T63441D Toxic effect of venom of bees, accidental (unintentional), subsequent encounter: Secondary | ICD-10-CM

## 2019-02-14 DIAGNOSIS — M25612 Stiffness of left shoulder, not elsewhere classified: Secondary | ICD-10-CM | POA: Diagnosis not present

## 2019-02-14 DIAGNOSIS — M25512 Pain in left shoulder: Secondary | ICD-10-CM | POA: Diagnosis not present

## 2019-02-14 DIAGNOSIS — M25412 Effusion, left shoulder: Secondary | ICD-10-CM | POA: Diagnosis not present

## 2019-02-14 DIAGNOSIS — M6281 Muscle weakness (generalized): Secondary | ICD-10-CM | POA: Diagnosis not present

## 2019-02-18 DIAGNOSIS — M25612 Stiffness of left shoulder, not elsewhere classified: Secondary | ICD-10-CM | POA: Diagnosis not present

## 2019-02-18 DIAGNOSIS — M25412 Effusion, left shoulder: Secondary | ICD-10-CM | POA: Diagnosis not present

## 2019-02-18 DIAGNOSIS — M25512 Pain in left shoulder: Secondary | ICD-10-CM | POA: Diagnosis not present

## 2019-02-18 DIAGNOSIS — M6281 Muscle weakness (generalized): Secondary | ICD-10-CM | POA: Diagnosis not present

## 2019-03-04 DIAGNOSIS — M25612 Stiffness of left shoulder, not elsewhere classified: Secondary | ICD-10-CM | POA: Diagnosis not present

## 2019-03-04 DIAGNOSIS — M25512 Pain in left shoulder: Secondary | ICD-10-CM | POA: Diagnosis not present

## 2019-03-04 DIAGNOSIS — M25412 Effusion, left shoulder: Secondary | ICD-10-CM | POA: Diagnosis not present

## 2019-03-04 DIAGNOSIS — M6281 Muscle weakness (generalized): Secondary | ICD-10-CM | POA: Diagnosis not present

## 2019-03-11 DIAGNOSIS — M25412 Effusion, left shoulder: Secondary | ICD-10-CM | POA: Diagnosis not present

## 2019-03-11 DIAGNOSIS — M6281 Muscle weakness (generalized): Secondary | ICD-10-CM | POA: Diagnosis not present

## 2019-03-11 DIAGNOSIS — M25612 Stiffness of left shoulder, not elsewhere classified: Secondary | ICD-10-CM | POA: Diagnosis not present

## 2019-03-11 DIAGNOSIS — M25512 Pain in left shoulder: Secondary | ICD-10-CM | POA: Diagnosis not present

## 2019-03-19 DIAGNOSIS — Z4789 Encounter for other orthopedic aftercare: Secondary | ICD-10-CM | POA: Diagnosis not present

## 2019-03-20 DIAGNOSIS — M25512 Pain in left shoulder: Secondary | ICD-10-CM | POA: Diagnosis not present

## 2019-03-20 DIAGNOSIS — M25412 Effusion, left shoulder: Secondary | ICD-10-CM | POA: Diagnosis not present

## 2019-03-20 DIAGNOSIS — M25612 Stiffness of left shoulder, not elsewhere classified: Secondary | ICD-10-CM | POA: Diagnosis not present

## 2019-03-20 DIAGNOSIS — M6281 Muscle weakness (generalized): Secondary | ICD-10-CM | POA: Diagnosis not present

## 2019-04-02 DIAGNOSIS — R5382 Chronic fatigue, unspecified: Secondary | ICD-10-CM | POA: Diagnosis not present

## 2019-04-02 DIAGNOSIS — I10 Essential (primary) hypertension: Secondary | ICD-10-CM | POA: Diagnosis not present

## 2019-04-02 DIAGNOSIS — E7800 Pure hypercholesterolemia, unspecified: Secondary | ICD-10-CM | POA: Diagnosis not present

## 2019-04-02 DIAGNOSIS — K5090 Crohn's disease, unspecified, without complications: Secondary | ICD-10-CM | POA: Diagnosis not present

## 2019-04-02 DIAGNOSIS — E1165 Type 2 diabetes mellitus with hyperglycemia: Secondary | ICD-10-CM | POA: Diagnosis not present

## 2019-04-04 DIAGNOSIS — Z0001 Encounter for general adult medical examination with abnormal findings: Secondary | ICD-10-CM | POA: Diagnosis not present

## 2019-04-09 ENCOUNTER — Ambulatory Visit (INDEPENDENT_AMBULATORY_CARE_PROVIDER_SITE_OTHER): Payer: BLUE CROSS/BLUE SHIELD

## 2019-04-09 ENCOUNTER — Other Ambulatory Visit: Payer: Self-pay

## 2019-04-09 DIAGNOSIS — T63441D Toxic effect of venom of bees, accidental (unintentional), subsequent encounter: Secondary | ICD-10-CM

## 2019-04-10 DIAGNOSIS — K8020 Calculus of gallbladder without cholecystitis without obstruction: Secondary | ICD-10-CM | POA: Diagnosis not present

## 2019-04-10 DIAGNOSIS — N200 Calculus of kidney: Secondary | ICD-10-CM | POA: Diagnosis not present

## 2019-04-10 DIAGNOSIS — K5090 Crohn's disease, unspecified, without complications: Secondary | ICD-10-CM | POA: Diagnosis not present

## 2019-04-10 DIAGNOSIS — R945 Abnormal results of liver function studies: Secondary | ICD-10-CM | POA: Diagnosis not present

## 2019-04-17 DIAGNOSIS — L570 Actinic keratosis: Secondary | ICD-10-CM | POA: Diagnosis not present

## 2019-04-17 DIAGNOSIS — D229 Melanocytic nevi, unspecified: Secondary | ICD-10-CM | POA: Diagnosis not present

## 2019-04-23 DIAGNOSIS — M5136 Other intervertebral disc degeneration, lumbar region: Secondary | ICD-10-CM | POA: Diagnosis not present

## 2019-04-23 DIAGNOSIS — M4726 Other spondylosis with radiculopathy, lumbar region: Secondary | ICD-10-CM | POA: Diagnosis not present

## 2019-04-23 DIAGNOSIS — M48061 Spinal stenosis, lumbar region without neurogenic claudication: Secondary | ICD-10-CM | POA: Diagnosis not present

## 2019-04-24 DIAGNOSIS — R945 Abnormal results of liver function studies: Secondary | ICD-10-CM | POA: Diagnosis not present

## 2019-04-24 DIAGNOSIS — K5090 Crohn's disease, unspecified, without complications: Secondary | ICD-10-CM | POA: Diagnosis not present

## 2019-04-24 DIAGNOSIS — K8020 Calculus of gallbladder without cholecystitis without obstruction: Secondary | ICD-10-CM | POA: Diagnosis not present

## 2019-05-20 DIAGNOSIS — R7989 Other specified abnormal findings of blood chemistry: Secondary | ICD-10-CM | POA: Diagnosis not present

## 2019-05-20 DIAGNOSIS — K8020 Calculus of gallbladder without cholecystitis without obstruction: Secondary | ICD-10-CM | POA: Diagnosis not present

## 2019-05-20 DIAGNOSIS — K5100 Ulcerative (chronic) pancolitis without complications: Secondary | ICD-10-CM | POA: Diagnosis not present

## 2019-05-22 DIAGNOSIS — K8020 Calculus of gallbladder without cholecystitis without obstruction: Secondary | ICD-10-CM | POA: Diagnosis not present

## 2019-05-22 DIAGNOSIS — R7989 Other specified abnormal findings of blood chemistry: Secondary | ICD-10-CM | POA: Diagnosis not present

## 2019-06-13 ENCOUNTER — Ambulatory Visit (INDEPENDENT_AMBULATORY_CARE_PROVIDER_SITE_OTHER): Payer: BLUE CROSS/BLUE SHIELD

## 2019-06-13 DIAGNOSIS — T63441D Toxic effect of venom of bees, accidental (unintentional), subsequent encounter: Secondary | ICD-10-CM

## 2019-06-30 DIAGNOSIS — Z4789 Encounter for other orthopedic aftercare: Secondary | ICD-10-CM | POA: Diagnosis not present

## 2019-07-04 DIAGNOSIS — K8020 Calculus of gallbladder without cholecystitis without obstruction: Secondary | ICD-10-CM | POA: Diagnosis not present

## 2019-07-04 DIAGNOSIS — K5100 Ulcerative (chronic) pancolitis without complications: Secondary | ICD-10-CM | POA: Diagnosis not present

## 2019-07-07 DIAGNOSIS — R7989 Other specified abnormal findings of blood chemistry: Secondary | ICD-10-CM | POA: Diagnosis not present

## 2019-08-06 DIAGNOSIS — R7989 Other specified abnormal findings of blood chemistry: Secondary | ICD-10-CM | POA: Diagnosis not present

## 2019-08-13 ENCOUNTER — Ambulatory Visit (INDEPENDENT_AMBULATORY_CARE_PROVIDER_SITE_OTHER): Payer: BLUE CROSS/BLUE SHIELD

## 2019-08-13 DIAGNOSIS — T63441D Toxic effect of venom of bees, accidental (unintentional), subsequent encounter: Secondary | ICD-10-CM

## 2019-08-26 DIAGNOSIS — K8020 Calculus of gallbladder without cholecystitis without obstruction: Secondary | ICD-10-CM | POA: Diagnosis not present

## 2019-08-26 DIAGNOSIS — Z7289 Other problems related to lifestyle: Secondary | ICD-10-CM | POA: Diagnosis not present

## 2019-08-26 DIAGNOSIS — R7989 Other specified abnormal findings of blood chemistry: Secondary | ICD-10-CM | POA: Diagnosis not present

## 2019-09-08 DIAGNOSIS — R7989 Other specified abnormal findings of blood chemistry: Secondary | ICD-10-CM | POA: Diagnosis not present

## 2019-10-02 DIAGNOSIS — G2581 Restless legs syndrome: Secondary | ICD-10-CM | POA: Diagnosis not present

## 2019-10-02 DIAGNOSIS — M5430 Sciatica, unspecified side: Secondary | ICD-10-CM | POA: Diagnosis not present

## 2019-10-02 DIAGNOSIS — K5090 Crohn's disease, unspecified, without complications: Secondary | ICD-10-CM | POA: Diagnosis not present

## 2019-10-02 DIAGNOSIS — E1165 Type 2 diabetes mellitus with hyperglycemia: Secondary | ICD-10-CM | POA: Diagnosis not present

## 2019-10-02 DIAGNOSIS — G4730 Sleep apnea, unspecified: Secondary | ICD-10-CM | POA: Diagnosis not present

## 2019-10-22 ENCOUNTER — Ambulatory Visit (INDEPENDENT_AMBULATORY_CARE_PROVIDER_SITE_OTHER): Payer: Medicare Other

## 2019-10-22 DIAGNOSIS — T63441D Toxic effect of venom of bees, accidental (unintentional), subsequent encounter: Secondary | ICD-10-CM | POA: Diagnosis not present

## 2019-10-29 DIAGNOSIS — K8020 Calculus of gallbladder without cholecystitis without obstruction: Secondary | ICD-10-CM | POA: Diagnosis not present

## 2019-10-29 DIAGNOSIS — K5100 Ulcerative (chronic) pancolitis without complications: Secondary | ICD-10-CM | POA: Diagnosis not present

## 2019-10-29 DIAGNOSIS — R7989 Other specified abnormal findings of blood chemistry: Secondary | ICD-10-CM | POA: Diagnosis not present

## 2019-11-11 DIAGNOSIS — M5136 Other intervertebral disc degeneration, lumbar region: Secondary | ICD-10-CM | POA: Diagnosis not present

## 2019-11-11 DIAGNOSIS — M4726 Other spondylosis with radiculopathy, lumbar region: Secondary | ICD-10-CM | POA: Diagnosis not present

## 2019-11-27 DIAGNOSIS — M205X1 Other deformities of toe(s) (acquired), right foot: Secondary | ICD-10-CM | POA: Diagnosis not present

## 2019-11-27 DIAGNOSIS — M778 Other enthesopathies, not elsewhere classified: Secondary | ICD-10-CM | POA: Diagnosis not present

## 2019-11-27 DIAGNOSIS — M2011 Hallux valgus (acquired), right foot: Secondary | ICD-10-CM | POA: Diagnosis not present

## 2019-12-03 DIAGNOSIS — D0439 Carcinoma in situ of skin of other parts of face: Secondary | ICD-10-CM | POA: Diagnosis not present

## 2019-12-03 DIAGNOSIS — D485 Neoplasm of uncertain behavior of skin: Secondary | ICD-10-CM | POA: Diagnosis not present

## 2019-12-03 DIAGNOSIS — L570 Actinic keratosis: Secondary | ICD-10-CM | POA: Diagnosis not present

## 2019-12-03 DIAGNOSIS — D045 Carcinoma in situ of skin of trunk: Secondary | ICD-10-CM | POA: Diagnosis not present

## 2019-12-24 ENCOUNTER — Ambulatory Visit (INDEPENDENT_AMBULATORY_CARE_PROVIDER_SITE_OTHER): Payer: Medicare Other

## 2019-12-24 ENCOUNTER — Ambulatory Visit: Payer: Medicare Other

## 2019-12-24 ENCOUNTER — Other Ambulatory Visit: Payer: Self-pay

## 2019-12-24 DIAGNOSIS — T63441D Toxic effect of venom of bees, accidental (unintentional), subsequent encounter: Secondary | ICD-10-CM | POA: Diagnosis not present

## 2020-01-12 DIAGNOSIS — M47812 Spondylosis without myelopathy or radiculopathy, cervical region: Secondary | ICD-10-CM | POA: Diagnosis not present

## 2020-01-12 DIAGNOSIS — M9902 Segmental and somatic dysfunction of thoracic region: Secondary | ICD-10-CM | POA: Diagnosis not present

## 2020-01-12 DIAGNOSIS — M546 Pain in thoracic spine: Secondary | ICD-10-CM | POA: Diagnosis not present

## 2020-01-12 DIAGNOSIS — M9903 Segmental and somatic dysfunction of lumbar region: Secondary | ICD-10-CM | POA: Diagnosis not present

## 2020-01-12 DIAGNOSIS — M9901 Segmental and somatic dysfunction of cervical region: Secondary | ICD-10-CM | POA: Diagnosis not present

## 2020-01-15 DIAGNOSIS — R7989 Other specified abnormal findings of blood chemistry: Secondary | ICD-10-CM | POA: Diagnosis not present

## 2020-01-15 DIAGNOSIS — K5100 Ulcerative (chronic) pancolitis without complications: Secondary | ICD-10-CM | POA: Diagnosis not present

## 2020-01-16 DIAGNOSIS — L570 Actinic keratosis: Secondary | ICD-10-CM | POA: Diagnosis not present

## 2020-01-16 DIAGNOSIS — D045 Carcinoma in situ of skin of trunk: Secondary | ICD-10-CM | POA: Diagnosis not present

## 2020-01-21 DIAGNOSIS — M9901 Segmental and somatic dysfunction of cervical region: Secondary | ICD-10-CM | POA: Diagnosis not present

## 2020-01-21 DIAGNOSIS — M9903 Segmental and somatic dysfunction of lumbar region: Secondary | ICD-10-CM | POA: Diagnosis not present

## 2020-01-21 DIAGNOSIS — M546 Pain in thoracic spine: Secondary | ICD-10-CM | POA: Diagnosis not present

## 2020-01-21 DIAGNOSIS — M47812 Spondylosis without myelopathy or radiculopathy, cervical region: Secondary | ICD-10-CM | POA: Diagnosis not present

## 2020-01-21 DIAGNOSIS — M9902 Segmental and somatic dysfunction of thoracic region: Secondary | ICD-10-CM | POA: Diagnosis not present

## 2020-02-04 ENCOUNTER — Ambulatory Visit (INDEPENDENT_AMBULATORY_CARE_PROVIDER_SITE_OTHER): Payer: Medicare Other

## 2020-02-04 DIAGNOSIS — T63441D Toxic effect of venom of bees, accidental (unintentional), subsequent encounter: Secondary | ICD-10-CM | POA: Diagnosis not present

## 2020-03-17 ENCOUNTER — Ambulatory Visit: Payer: Self-pay

## 2020-03-19 DIAGNOSIS — M778 Other enthesopathies, not elsewhere classified: Secondary | ICD-10-CM | POA: Diagnosis not present

## 2020-03-19 DIAGNOSIS — M2011 Hallux valgus (acquired), right foot: Secondary | ICD-10-CM | POA: Diagnosis not present

## 2020-03-19 DIAGNOSIS — M205X1 Other deformities of toe(s) (acquired), right foot: Secondary | ICD-10-CM | POA: Diagnosis not present

## 2020-03-24 ENCOUNTER — Other Ambulatory Visit: Payer: Self-pay

## 2020-03-24 ENCOUNTER — Ambulatory Visit (INDEPENDENT_AMBULATORY_CARE_PROVIDER_SITE_OTHER): Payer: Medicare Other

## 2020-03-24 DIAGNOSIS — T63441D Toxic effect of venom of bees, accidental (unintentional), subsequent encounter: Secondary | ICD-10-CM

## 2020-04-02 DIAGNOSIS — K5090 Crohn's disease, unspecified, without complications: Secondary | ICD-10-CM | POA: Diagnosis not present

## 2020-04-02 DIAGNOSIS — I10 Essential (primary) hypertension: Secondary | ICD-10-CM | POA: Diagnosis not present

## 2020-04-02 DIAGNOSIS — E1165 Type 2 diabetes mellitus with hyperglycemia: Secondary | ICD-10-CM | POA: Diagnosis not present

## 2020-04-02 DIAGNOSIS — G2581 Restless legs syndrome: Secondary | ICD-10-CM | POA: Diagnosis not present

## 2020-04-02 DIAGNOSIS — E7800 Pure hypercholesterolemia, unspecified: Secondary | ICD-10-CM | POA: Diagnosis not present

## 2020-04-07 DIAGNOSIS — Z0001 Encounter for general adult medical examination with abnormal findings: Secondary | ICD-10-CM | POA: Diagnosis not present

## 2020-05-05 ENCOUNTER — Ambulatory Visit (INDEPENDENT_AMBULATORY_CARE_PROVIDER_SITE_OTHER): Payer: Medicare Other

## 2020-05-05 ENCOUNTER — Other Ambulatory Visit: Payer: Self-pay

## 2020-05-05 DIAGNOSIS — T63441D Toxic effect of venom of bees, accidental (unintentional), subsequent encounter: Secondary | ICD-10-CM | POA: Diagnosis not present

## 2020-05-05 DIAGNOSIS — J309 Allergic rhinitis, unspecified: Secondary | ICD-10-CM

## 2020-06-30 ENCOUNTER — Ambulatory Visit: Payer: Self-pay

## 2020-06-30 ENCOUNTER — Other Ambulatory Visit: Payer: Self-pay

## 2020-06-30 ENCOUNTER — Ambulatory Visit (INDEPENDENT_AMBULATORY_CARE_PROVIDER_SITE_OTHER): Payer: Medicare Other

## 2020-06-30 DIAGNOSIS — T63441D Toxic effect of venom of bees, accidental (unintentional), subsequent encounter: Secondary | ICD-10-CM | POA: Diagnosis not present

## 2020-07-02 DIAGNOSIS — E7800 Pure hypercholesterolemia, unspecified: Secondary | ICD-10-CM | POA: Diagnosis not present

## 2020-07-02 DIAGNOSIS — R5382 Chronic fatigue, unspecified: Secondary | ICD-10-CM | POA: Diagnosis not present

## 2020-07-02 DIAGNOSIS — E1165 Type 2 diabetes mellitus with hyperglycemia: Secondary | ICD-10-CM | POA: Diagnosis not present

## 2020-07-02 DIAGNOSIS — I10 Essential (primary) hypertension: Secondary | ICD-10-CM | POA: Diagnosis not present

## 2020-07-06 DIAGNOSIS — I10 Essential (primary) hypertension: Secondary | ICD-10-CM | POA: Diagnosis not present

## 2020-07-06 DIAGNOSIS — E1165 Type 2 diabetes mellitus with hyperglycemia: Secondary | ICD-10-CM | POA: Diagnosis not present

## 2020-07-06 DIAGNOSIS — K5090 Crohn's disease, unspecified, without complications: Secondary | ICD-10-CM | POA: Diagnosis not present

## 2020-07-06 DIAGNOSIS — Z1389 Encounter for screening for other disorder: Secondary | ICD-10-CM | POA: Diagnosis not present

## 2020-07-06 DIAGNOSIS — G4730 Sleep apnea, unspecified: Secondary | ICD-10-CM | POA: Diagnosis not present

## 2020-07-06 DIAGNOSIS — G2581 Restless legs syndrome: Secondary | ICD-10-CM | POA: Diagnosis not present

## 2020-07-24 DIAGNOSIS — S59202A Unspecified physeal fracture of lower end of radius, left arm, initial encounter for closed fracture: Secondary | ICD-10-CM | POA: Diagnosis not present

## 2020-07-24 DIAGNOSIS — S5292XA Unspecified fracture of left forearm, initial encounter for closed fracture: Secondary | ICD-10-CM | POA: Diagnosis not present

## 2020-07-24 DIAGNOSIS — R2232 Localized swelling, mass and lump, left upper limb: Secondary | ICD-10-CM | POA: Diagnosis not present

## 2020-07-24 DIAGNOSIS — S52302A Unspecified fracture of shaft of left radius, initial encounter for closed fracture: Secondary | ICD-10-CM | POA: Diagnosis not present

## 2020-07-24 DIAGNOSIS — S52502D Unspecified fracture of the lower end of left radius, subsequent encounter for closed fracture with routine healing: Secondary | ICD-10-CM | POA: Diagnosis not present

## 2020-07-24 DIAGNOSIS — S52502A Unspecified fracture of the lower end of left radius, initial encounter for closed fracture: Secondary | ICD-10-CM | POA: Diagnosis not present

## 2020-07-24 DIAGNOSIS — M79632 Pain in left forearm: Secondary | ICD-10-CM | POA: Diagnosis not present

## 2020-07-26 DIAGNOSIS — S52552A Other extraarticular fracture of lower end of left radius, initial encounter for closed fracture: Secondary | ICD-10-CM | POA: Diagnosis not present

## 2020-07-29 DIAGNOSIS — Z86010 Personal history of colonic polyps: Secondary | ICD-10-CM | POA: Diagnosis not present

## 2020-07-29 DIAGNOSIS — Z7984 Long term (current) use of oral hypoglycemic drugs: Secondary | ICD-10-CM | POA: Diagnosis not present

## 2020-07-29 DIAGNOSIS — I10 Essential (primary) hypertension: Secondary | ICD-10-CM | POA: Diagnosis not present

## 2020-07-29 DIAGNOSIS — K5190 Ulcerative colitis, unspecified, without complications: Secondary | ICD-10-CM | POA: Diagnosis not present

## 2020-07-29 DIAGNOSIS — Z7982 Long term (current) use of aspirin: Secondary | ICD-10-CM | POA: Diagnosis not present

## 2020-07-29 DIAGNOSIS — G4730 Sleep apnea, unspecified: Secondary | ICD-10-CM | POA: Diagnosis not present

## 2020-07-29 DIAGNOSIS — G629 Polyneuropathy, unspecified: Secondary | ICD-10-CM | POA: Diagnosis not present

## 2020-07-29 DIAGNOSIS — Z8051 Family history of malignant neoplasm of kidney: Secondary | ICD-10-CM | POA: Diagnosis not present

## 2020-07-29 DIAGNOSIS — S52502A Unspecified fracture of the lower end of left radius, initial encounter for closed fracture: Secondary | ICD-10-CM | POA: Diagnosis not present

## 2020-07-29 DIAGNOSIS — Z96651 Presence of right artificial knee joint: Secondary | ICD-10-CM | POA: Diagnosis not present

## 2020-07-29 DIAGNOSIS — M1990 Unspecified osteoarthritis, unspecified site: Secondary | ICD-10-CM | POA: Diagnosis not present

## 2020-07-29 DIAGNOSIS — S52552A Other extraarticular fracture of lower end of left radius, initial encounter for closed fracture: Secondary | ICD-10-CM | POA: Diagnosis not present

## 2020-07-29 DIAGNOSIS — G8918 Other acute postprocedural pain: Secondary | ICD-10-CM | POA: Diagnosis not present

## 2020-07-29 DIAGNOSIS — E785 Hyperlipidemia, unspecified: Secondary | ICD-10-CM | POA: Diagnosis not present

## 2020-07-29 DIAGNOSIS — Z9011 Acquired absence of right breast and nipple: Secondary | ICD-10-CM | POA: Diagnosis not present

## 2020-07-29 DIAGNOSIS — S52352D Displaced comminuted fracture of shaft of radius, left arm, subsequent encounter for closed fracture with routine healing: Secondary | ICD-10-CM | POA: Diagnosis not present

## 2020-07-29 DIAGNOSIS — G894 Chronic pain syndrome: Secondary | ICD-10-CM | POA: Diagnosis not present

## 2020-07-29 DIAGNOSIS — E119 Type 2 diabetes mellitus without complications: Secondary | ICD-10-CM | POA: Diagnosis not present

## 2020-07-29 DIAGNOSIS — W19XXXA Unspecified fall, initial encounter: Secondary | ICD-10-CM | POA: Diagnosis not present

## 2020-07-29 DIAGNOSIS — Z79899 Other long term (current) drug therapy: Secondary | ICD-10-CM | POA: Diagnosis not present

## 2020-08-09 ENCOUNTER — Other Ambulatory Visit: Payer: Self-pay | Admitting: Nurse Practitioner

## 2020-08-09 DIAGNOSIS — U071 COVID-19: Secondary | ICD-10-CM

## 2020-08-09 DIAGNOSIS — Z79899 Other long term (current) drug therapy: Secondary | ICD-10-CM

## 2020-08-09 DIAGNOSIS — S52552A Other extraarticular fracture of lower end of left radius, initial encounter for closed fracture: Secondary | ICD-10-CM | POA: Diagnosis not present

## 2020-08-09 DIAGNOSIS — E119 Type 2 diabetes mellitus without complications: Secondary | ICD-10-CM

## 2020-08-09 DIAGNOSIS — Z7960 Long term (current) use of unspecified immunomodulators and immunosuppressants: Secondary | ICD-10-CM

## 2020-08-09 NOTE — Progress Notes (Signed)
Joseph Chase is a 64 y.o. male that meets the FDA criteria for Emergency Use Authorization of casirivimab/imdevimab (REGEN-COV) for post-exposure prophylaxis of COVID-19 in individuals who are at high risk for progression to severe COVID-19, including hospitalization or death, and are: . Not fully vaccinated or who are not expected to mount an adequate immune response to complete SARS-CoV-2 vaccination (for example, individuals with immunocompromising conditions including those taking immunosuppressive medications) and - Have been exposed to an individual infected with SARS-CoV-2 consistent with close contact criteria per CDC or - Who are at high risk of exposure to an individual infected with SARS-CoV-2 because of occurrence of COVID-19 infection in other individuals in the same institutional setting (for example, nursing homes or prisons) . For individuals in whom repeat dosing is determined to be appropriate for ongoing exposure to SARS-CoV-2 for longer than 4 weeks and who are not expected to mount an adequate immune response to complete SARS-CoV-2 vaccination, the initial dose is 600 mg of casirivimab and 600 mg of imdevimab (1277m total) by subcutaneous injection or intravenous infusion followed by subsequent repeat dosing of 300 mg of casirivimab and 300 mg of imdevimab (604mtotal) by subcutaneous injection or intravenous infusion once every 4 weeks for the duration of ongoing exposure.   Patient has met the above post-exposure definition and has the following high risk criteria: Immunosuppressive Disease or Treatment  I have spoken and communicated the following to the patient or parent/caregiver regarding COVID monoclonal antibody treatment:   FDA has authorized REGEN-COV for emergency use of post-exposure prophylaxis of COVID-19   The significant known and potential risks and benefits of COVID monoclonal antibody, and the extent to which such potential risks and benefits are  unknown.   Information on available alternative treatments and the risks and benefits of those alternatives, including clinical trials.   Patients treated with COVID monoclonal antibody should continue to self-isolate and use infection control measures (e.g., wear mask, isolate, social distance, avoid sharing personal items, clean and disinfect "high touch" surfaces, and frequent handwashing) according to CDC guidelines.    The patient or parent/caregiver has the option to accept or refuse COVID monoclonal antibody treatment.  After reviewing this information with the patient, The patient has agreed to proceed with receiving casirivimab\imdevimab 120031mnjection and will be provided a copy of the patient fact sheet prior to receiving the injection.  TonFenton FoyP 08/09/2020 2:36 PM

## 2020-08-11 ENCOUNTER — Ambulatory Visit (INDEPENDENT_AMBULATORY_CARE_PROVIDER_SITE_OTHER): Payer: Medicare Other

## 2020-08-11 VITALS — BP 138/76 | HR 62 | Temp 98.1°F

## 2020-08-11 DIAGNOSIS — E119 Type 2 diabetes mellitus without complications: Secondary | ICD-10-CM

## 2020-08-11 DIAGNOSIS — Z7960 Long term (current) use of unspecified immunomodulators and immunosuppressants: Secondary | ICD-10-CM

## 2020-08-11 DIAGNOSIS — I10 Essential (primary) hypertension: Secondary | ICD-10-CM

## 2020-08-11 DIAGNOSIS — U071 COVID-19: Secondary | ICD-10-CM

## 2020-08-11 DIAGNOSIS — Z79899 Other long term (current) drug therapy: Secondary | ICD-10-CM

## 2020-08-11 MED ORDER — CASIRIVIMAB-IMDEVIMAB 600-600 MG/10ML IJ SOLN
300.0000 mg | INTRAMUSCULAR | Status: AC
  Administered 2020-08-11 (×4): 300 mg via SUBCUTANEOUS

## 2020-08-11 MED ORDER — CLONIDINE HCL 0.1 MG PO TABS
0.1000 mg | ORAL_TABLET | Freq: Once | ORAL | Status: AC
  Administered 2020-08-11: 0.1 mg via ORAL

## 2020-08-11 NOTE — Progress Notes (Signed)
  Diagnosis: COVID-19 post-exposure prophylaxis  Provider: Lazaro Arms, NP  Procedure: casirivimab\imdevimab subcutaneous injection - Provided patient with casirivimab\imdevimab fact sheet for patients, parents and caregivers prior to injection.  Complications: No immediate complications noted.  Discharge: Discharged home   Ritta Slot, [place] 08/11/2020

## 2020-08-11 NOTE — Progress Notes (Signed)
  Diagnosis: COVID-19 post-exposure prophylaxis  Provider: Lazaro Arms  Procedure: casirivimab\imdevimab subcutaneous injection - Provided patient with casirivimab\imdevimab fact sheet for patients, parents and caregivers prior to injection.  Complications:  No immediate complications noted.   Discharge: Discharged home   Leslee Home, [place] 08/11/2020

## 2020-08-11 NOTE — Patient Instructions (Signed)

## 2020-08-25 ENCOUNTER — Other Ambulatory Visit: Payer: Self-pay

## 2020-08-25 ENCOUNTER — Ambulatory Visit (INDEPENDENT_AMBULATORY_CARE_PROVIDER_SITE_OTHER): Payer: Medicare Other

## 2020-08-25 ENCOUNTER — Ambulatory Visit: Payer: Self-pay

## 2020-08-25 DIAGNOSIS — S52552D Other extraarticular fracture of lower end of left radius, subsequent encounter for closed fracture with routine healing: Secondary | ICD-10-CM | POA: Diagnosis not present

## 2020-08-25 DIAGNOSIS — M25532 Pain in left wrist: Secondary | ICD-10-CM | POA: Diagnosis not present

## 2020-08-25 DIAGNOSIS — T63441D Toxic effect of venom of bees, accidental (unintentional), subsequent encounter: Secondary | ICD-10-CM

## 2020-08-25 DIAGNOSIS — M79642 Pain in left hand: Secondary | ICD-10-CM | POA: Diagnosis not present

## 2020-08-25 DIAGNOSIS — M25632 Stiffness of left wrist, not elsewhere classified: Secondary | ICD-10-CM | POA: Diagnosis not present

## 2020-08-25 DIAGNOSIS — M25642 Stiffness of left hand, not elsewhere classified: Secondary | ICD-10-CM | POA: Diagnosis not present

## 2020-08-27 DIAGNOSIS — M79642 Pain in left hand: Secondary | ICD-10-CM | POA: Diagnosis not present

## 2020-08-27 DIAGNOSIS — M25532 Pain in left wrist: Secondary | ICD-10-CM | POA: Diagnosis not present

## 2020-08-27 DIAGNOSIS — M25642 Stiffness of left hand, not elsewhere classified: Secondary | ICD-10-CM | POA: Diagnosis not present

## 2020-08-27 DIAGNOSIS — S52552D Other extraarticular fracture of lower end of left radius, subsequent encounter for closed fracture with routine healing: Secondary | ICD-10-CM | POA: Diagnosis not present

## 2020-08-27 DIAGNOSIS — M25632 Stiffness of left wrist, not elsewhere classified: Secondary | ICD-10-CM | POA: Diagnosis not present

## 2020-08-30 DIAGNOSIS — S52552D Other extraarticular fracture of lower end of left radius, subsequent encounter for closed fracture with routine healing: Secondary | ICD-10-CM | POA: Diagnosis not present

## 2020-08-30 DIAGNOSIS — M79642 Pain in left hand: Secondary | ICD-10-CM | POA: Diagnosis not present

## 2020-08-30 DIAGNOSIS — M25642 Stiffness of left hand, not elsewhere classified: Secondary | ICD-10-CM | POA: Diagnosis not present

## 2020-08-30 DIAGNOSIS — M25532 Pain in left wrist: Secondary | ICD-10-CM | POA: Diagnosis not present

## 2020-08-30 DIAGNOSIS — M25632 Stiffness of left wrist, not elsewhere classified: Secondary | ICD-10-CM | POA: Diagnosis not present

## 2020-08-30 DIAGNOSIS — S52552A Other extraarticular fracture of lower end of left radius, initial encounter for closed fracture: Secondary | ICD-10-CM | POA: Diagnosis not present

## 2020-08-31 DIAGNOSIS — K8020 Calculus of gallbladder without cholecystitis without obstruction: Secondary | ICD-10-CM | POA: Diagnosis not present

## 2020-08-31 DIAGNOSIS — K5100 Ulcerative (chronic) pancolitis without complications: Secondary | ICD-10-CM | POA: Diagnosis not present

## 2020-08-31 DIAGNOSIS — R7989 Other specified abnormal findings of blood chemistry: Secondary | ICD-10-CM | POA: Diagnosis not present

## 2020-08-31 DIAGNOSIS — E119 Type 2 diabetes mellitus without complications: Secondary | ICD-10-CM | POA: Diagnosis not present

## 2020-09-01 DIAGNOSIS — K5100 Ulcerative (chronic) pancolitis without complications: Secondary | ICD-10-CM | POA: Diagnosis not present

## 2020-09-01 DIAGNOSIS — E119 Type 2 diabetes mellitus without complications: Secondary | ICD-10-CM | POA: Diagnosis not present

## 2020-09-01 DIAGNOSIS — R7989 Other specified abnormal findings of blood chemistry: Secondary | ICD-10-CM | POA: Diagnosis not present

## 2020-09-01 DIAGNOSIS — K8020 Calculus of gallbladder without cholecystitis without obstruction: Secondary | ICD-10-CM | POA: Diagnosis not present

## 2020-09-03 DIAGNOSIS — M25632 Stiffness of left wrist, not elsewhere classified: Secondary | ICD-10-CM | POA: Diagnosis not present

## 2020-09-03 DIAGNOSIS — M25642 Stiffness of left hand, not elsewhere classified: Secondary | ICD-10-CM | POA: Diagnosis not present

## 2020-09-03 DIAGNOSIS — M25532 Pain in left wrist: Secondary | ICD-10-CM | POA: Diagnosis not present

## 2020-09-03 DIAGNOSIS — M79642 Pain in left hand: Secondary | ICD-10-CM | POA: Diagnosis not present

## 2020-09-03 DIAGNOSIS — S52552D Other extraarticular fracture of lower end of left radius, subsequent encounter for closed fracture with routine healing: Secondary | ICD-10-CM | POA: Diagnosis not present

## 2020-09-06 DIAGNOSIS — M79642 Pain in left hand: Secondary | ICD-10-CM | POA: Diagnosis not present

## 2020-09-06 DIAGNOSIS — M25642 Stiffness of left hand, not elsewhere classified: Secondary | ICD-10-CM | POA: Diagnosis not present

## 2020-09-06 DIAGNOSIS — M25532 Pain in left wrist: Secondary | ICD-10-CM | POA: Diagnosis not present

## 2020-09-06 DIAGNOSIS — S52552D Other extraarticular fracture of lower end of left radius, subsequent encounter for closed fracture with routine healing: Secondary | ICD-10-CM | POA: Diagnosis not present

## 2020-09-06 DIAGNOSIS — M25632 Stiffness of left wrist, not elsewhere classified: Secondary | ICD-10-CM | POA: Diagnosis not present

## 2020-09-10 DIAGNOSIS — M79642 Pain in left hand: Secondary | ICD-10-CM | POA: Diagnosis not present

## 2020-09-10 DIAGNOSIS — S52552D Other extraarticular fracture of lower end of left radius, subsequent encounter for closed fracture with routine healing: Secondary | ICD-10-CM | POA: Diagnosis not present

## 2020-09-10 DIAGNOSIS — M25642 Stiffness of left hand, not elsewhere classified: Secondary | ICD-10-CM | POA: Diagnosis not present

## 2020-09-10 DIAGNOSIS — M25532 Pain in left wrist: Secondary | ICD-10-CM | POA: Diagnosis not present

## 2020-09-10 DIAGNOSIS — M25632 Stiffness of left wrist, not elsewhere classified: Secondary | ICD-10-CM | POA: Diagnosis not present

## 2020-09-15 DIAGNOSIS — M25532 Pain in left wrist: Secondary | ICD-10-CM | POA: Diagnosis not present

## 2020-09-15 DIAGNOSIS — M79642 Pain in left hand: Secondary | ICD-10-CM | POA: Diagnosis not present

## 2020-09-15 DIAGNOSIS — S52552D Other extraarticular fracture of lower end of left radius, subsequent encounter for closed fracture with routine healing: Secondary | ICD-10-CM | POA: Diagnosis not present

## 2020-09-15 DIAGNOSIS — M25632 Stiffness of left wrist, not elsewhere classified: Secondary | ICD-10-CM | POA: Diagnosis not present

## 2020-09-15 DIAGNOSIS — M25642 Stiffness of left hand, not elsewhere classified: Secondary | ICD-10-CM | POA: Diagnosis not present

## 2020-09-22 DIAGNOSIS — M25632 Stiffness of left wrist, not elsewhere classified: Secondary | ICD-10-CM | POA: Diagnosis not present

## 2020-09-22 DIAGNOSIS — M25642 Stiffness of left hand, not elsewhere classified: Secondary | ICD-10-CM | POA: Diagnosis not present

## 2020-09-22 DIAGNOSIS — S52552D Other extraarticular fracture of lower end of left radius, subsequent encounter for closed fracture with routine healing: Secondary | ICD-10-CM | POA: Diagnosis not present

## 2020-09-22 DIAGNOSIS — M79642 Pain in left hand: Secondary | ICD-10-CM | POA: Diagnosis not present

## 2020-09-22 DIAGNOSIS — M25532 Pain in left wrist: Secondary | ICD-10-CM | POA: Diagnosis not present

## 2020-09-24 DIAGNOSIS — M79642 Pain in left hand: Secondary | ICD-10-CM | POA: Diagnosis not present

## 2020-09-24 DIAGNOSIS — M25532 Pain in left wrist: Secondary | ICD-10-CM | POA: Diagnosis not present

## 2020-09-24 DIAGNOSIS — M25642 Stiffness of left hand, not elsewhere classified: Secondary | ICD-10-CM | POA: Diagnosis not present

## 2020-09-24 DIAGNOSIS — S52552D Other extraarticular fracture of lower end of left radius, subsequent encounter for closed fracture with routine healing: Secondary | ICD-10-CM | POA: Diagnosis not present

## 2020-09-24 DIAGNOSIS — M25632 Stiffness of left wrist, not elsewhere classified: Secondary | ICD-10-CM | POA: Diagnosis not present

## 2020-09-27 DIAGNOSIS — M25632 Stiffness of left wrist, not elsewhere classified: Secondary | ICD-10-CM | POA: Diagnosis not present

## 2020-09-27 DIAGNOSIS — M25642 Stiffness of left hand, not elsewhere classified: Secondary | ICD-10-CM | POA: Diagnosis not present

## 2020-09-27 DIAGNOSIS — M25532 Pain in left wrist: Secondary | ICD-10-CM | POA: Diagnosis not present

## 2020-09-27 DIAGNOSIS — M79642 Pain in left hand: Secondary | ICD-10-CM | POA: Diagnosis not present

## 2020-09-27 DIAGNOSIS — S52552D Other extraarticular fracture of lower end of left radius, subsequent encounter for closed fracture with routine healing: Secondary | ICD-10-CM | POA: Diagnosis not present

## 2020-09-29 DIAGNOSIS — S52502D Unspecified fracture of the lower end of left radius, subsequent encounter for closed fracture with routine healing: Secondary | ICD-10-CM | POA: Diagnosis not present

## 2020-10-01 DIAGNOSIS — M25532 Pain in left wrist: Secondary | ICD-10-CM | POA: Diagnosis not present

## 2020-10-01 DIAGNOSIS — S52552D Other extraarticular fracture of lower end of left radius, subsequent encounter for closed fracture with routine healing: Secondary | ICD-10-CM | POA: Diagnosis not present

## 2020-10-01 DIAGNOSIS — M25632 Stiffness of left wrist, not elsewhere classified: Secondary | ICD-10-CM | POA: Diagnosis not present

## 2020-10-01 DIAGNOSIS — M25642 Stiffness of left hand, not elsewhere classified: Secondary | ICD-10-CM | POA: Diagnosis not present

## 2020-10-01 DIAGNOSIS — M79642 Pain in left hand: Secondary | ICD-10-CM | POA: Diagnosis not present

## 2020-10-04 DIAGNOSIS — S52552D Other extraarticular fracture of lower end of left radius, subsequent encounter for closed fracture with routine healing: Secondary | ICD-10-CM | POA: Diagnosis not present

## 2020-10-04 DIAGNOSIS — M79642 Pain in left hand: Secondary | ICD-10-CM | POA: Diagnosis not present

## 2020-10-04 DIAGNOSIS — M25642 Stiffness of left hand, not elsewhere classified: Secondary | ICD-10-CM | POA: Diagnosis not present

## 2020-10-04 DIAGNOSIS — M25532 Pain in left wrist: Secondary | ICD-10-CM | POA: Diagnosis not present

## 2020-10-04 DIAGNOSIS — M25632 Stiffness of left wrist, not elsewhere classified: Secondary | ICD-10-CM | POA: Diagnosis not present

## 2020-10-06 DIAGNOSIS — S52552D Other extraarticular fracture of lower end of left radius, subsequent encounter for closed fracture with routine healing: Secondary | ICD-10-CM | POA: Diagnosis not present

## 2020-10-06 DIAGNOSIS — M79642 Pain in left hand: Secondary | ICD-10-CM | POA: Diagnosis not present

## 2020-10-06 DIAGNOSIS — M25632 Stiffness of left wrist, not elsewhere classified: Secondary | ICD-10-CM | POA: Diagnosis not present

## 2020-10-06 DIAGNOSIS — M25642 Stiffness of left hand, not elsewhere classified: Secondary | ICD-10-CM | POA: Diagnosis not present

## 2020-10-06 DIAGNOSIS — M25532 Pain in left wrist: Secondary | ICD-10-CM | POA: Diagnosis not present

## 2020-10-11 DIAGNOSIS — M25632 Stiffness of left wrist, not elsewhere classified: Secondary | ICD-10-CM | POA: Diagnosis not present

## 2020-10-11 DIAGNOSIS — M25532 Pain in left wrist: Secondary | ICD-10-CM | POA: Diagnosis not present

## 2020-10-11 DIAGNOSIS — M79642 Pain in left hand: Secondary | ICD-10-CM | POA: Diagnosis not present

## 2020-10-11 DIAGNOSIS — M25642 Stiffness of left hand, not elsewhere classified: Secondary | ICD-10-CM | POA: Diagnosis not present

## 2020-10-11 DIAGNOSIS — S52552D Other extraarticular fracture of lower end of left radius, subsequent encounter for closed fracture with routine healing: Secondary | ICD-10-CM | POA: Diagnosis not present

## 2020-10-13 DIAGNOSIS — S52552D Other extraarticular fracture of lower end of left radius, subsequent encounter for closed fracture with routine healing: Secondary | ICD-10-CM | POA: Diagnosis not present

## 2020-10-13 DIAGNOSIS — M25642 Stiffness of left hand, not elsewhere classified: Secondary | ICD-10-CM | POA: Diagnosis not present

## 2020-10-13 DIAGNOSIS — M79642 Pain in left hand: Secondary | ICD-10-CM | POA: Diagnosis not present

## 2020-10-13 DIAGNOSIS — M25532 Pain in left wrist: Secondary | ICD-10-CM | POA: Diagnosis not present

## 2020-10-13 DIAGNOSIS — M25632 Stiffness of left wrist, not elsewhere classified: Secondary | ICD-10-CM | POA: Diagnosis not present

## 2020-10-20 ENCOUNTER — Ambulatory Visit (INDEPENDENT_AMBULATORY_CARE_PROVIDER_SITE_OTHER): Payer: Medicare Other

## 2020-10-20 ENCOUNTER — Other Ambulatory Visit: Payer: Self-pay

## 2020-10-20 DIAGNOSIS — T63441D Toxic effect of venom of bees, accidental (unintentional), subsequent encounter: Secondary | ICD-10-CM

## 2020-10-27 DIAGNOSIS — M79642 Pain in left hand: Secondary | ICD-10-CM | POA: Diagnosis not present

## 2020-10-27 DIAGNOSIS — M25632 Stiffness of left wrist, not elsewhere classified: Secondary | ICD-10-CM | POA: Diagnosis not present

## 2020-10-27 DIAGNOSIS — M25532 Pain in left wrist: Secondary | ICD-10-CM | POA: Diagnosis not present

## 2020-10-27 DIAGNOSIS — M25642 Stiffness of left hand, not elsewhere classified: Secondary | ICD-10-CM | POA: Diagnosis not present

## 2020-10-27 DIAGNOSIS — S52552D Other extraarticular fracture of lower end of left radius, subsequent encounter for closed fracture with routine healing: Secondary | ICD-10-CM | POA: Diagnosis not present

## 2020-11-02 DIAGNOSIS — Z9889 Other specified postprocedural states: Secondary | ICD-10-CM | POA: Diagnosis not present

## 2020-11-02 DIAGNOSIS — Z967 Presence of other bone and tendon implants: Secondary | ICD-10-CM | POA: Diagnosis not present

## 2020-11-02 DIAGNOSIS — Z8781 Personal history of (healed) traumatic fracture: Secondary | ICD-10-CM | POA: Diagnosis not present

## 2020-11-10 DIAGNOSIS — S52552D Other extraarticular fracture of lower end of left radius, subsequent encounter for closed fracture with routine healing: Secondary | ICD-10-CM | POA: Diagnosis not present

## 2020-11-10 DIAGNOSIS — M25642 Stiffness of left hand, not elsewhere classified: Secondary | ICD-10-CM | POA: Diagnosis not present

## 2020-11-10 DIAGNOSIS — M79642 Pain in left hand: Secondary | ICD-10-CM | POA: Diagnosis not present

## 2020-11-10 DIAGNOSIS — M25532 Pain in left wrist: Secondary | ICD-10-CM | POA: Diagnosis not present

## 2020-11-10 DIAGNOSIS — M25632 Stiffness of left wrist, not elsewhere classified: Secondary | ICD-10-CM | POA: Diagnosis not present

## 2020-12-15 ENCOUNTER — Other Ambulatory Visit: Payer: Self-pay

## 2020-12-15 ENCOUNTER — Ambulatory Visit (INDEPENDENT_AMBULATORY_CARE_PROVIDER_SITE_OTHER): Payer: Medicare Other

## 2020-12-15 DIAGNOSIS — T63441D Toxic effect of venom of bees, accidental (unintentional), subsequent encounter: Secondary | ICD-10-CM | POA: Diagnosis not present

## 2020-12-21 DIAGNOSIS — R42 Dizziness and giddiness: Secondary | ICD-10-CM | POA: Diagnosis not present

## 2020-12-21 DIAGNOSIS — Z7689 Persons encountering health services in other specified circumstances: Secondary | ICD-10-CM | POA: Diagnosis not present

## 2020-12-21 DIAGNOSIS — R269 Unspecified abnormalities of gait and mobility: Secondary | ICD-10-CM | POA: Diagnosis not present

## 2020-12-22 DIAGNOSIS — R269 Unspecified abnormalities of gait and mobility: Secondary | ICD-10-CM | POA: Diagnosis not present

## 2020-12-22 DIAGNOSIS — G459 Transient cerebral ischemic attack, unspecified: Secondary | ICD-10-CM | POA: Diagnosis not present

## 2020-12-22 DIAGNOSIS — R42 Dizziness and giddiness: Secondary | ICD-10-CM | POA: Diagnosis not present

## 2020-12-22 DIAGNOSIS — I10 Essential (primary) hypertension: Secondary | ICD-10-CM | POA: Diagnosis not present

## 2020-12-22 DIAGNOSIS — I639 Cerebral infarction, unspecified: Secondary | ICD-10-CM | POA: Diagnosis not present

## 2020-12-23 DIAGNOSIS — R42 Dizziness and giddiness: Secondary | ICD-10-CM | POA: Diagnosis not present

## 2020-12-23 DIAGNOSIS — I10 Essential (primary) hypertension: Secondary | ICD-10-CM | POA: Diagnosis not present

## 2020-12-24 DIAGNOSIS — R42 Dizziness and giddiness: Secondary | ICD-10-CM | POA: Diagnosis not present

## 2020-12-28 DIAGNOSIS — R7989 Other specified abnormal findings of blood chemistry: Secondary | ICD-10-CM | POA: Diagnosis not present

## 2020-12-28 DIAGNOSIS — R269 Unspecified abnormalities of gait and mobility: Secondary | ICD-10-CM | POA: Diagnosis not present

## 2020-12-28 DIAGNOSIS — R42 Dizziness and giddiness: Secondary | ICD-10-CM | POA: Diagnosis not present

## 2021-01-07 DIAGNOSIS — I340 Nonrheumatic mitral (valve) insufficiency: Secondary | ICD-10-CM | POA: Diagnosis not present

## 2021-01-07 DIAGNOSIS — R079 Chest pain, unspecified: Secondary | ICD-10-CM | POA: Diagnosis not present

## 2021-01-07 DIAGNOSIS — E1140 Type 2 diabetes mellitus with diabetic neuropathy, unspecified: Secondary | ICD-10-CM | POA: Diagnosis not present

## 2021-01-07 DIAGNOSIS — I10 Essential (primary) hypertension: Secondary | ICD-10-CM | POA: Diagnosis not present

## 2021-01-07 DIAGNOSIS — R42 Dizziness and giddiness: Secondary | ICD-10-CM | POA: Diagnosis not present

## 2021-01-14 ENCOUNTER — Encounter: Payer: Self-pay | Admitting: *Deleted

## 2021-01-17 ENCOUNTER — Ambulatory Visit (INDEPENDENT_AMBULATORY_CARE_PROVIDER_SITE_OTHER): Payer: Medicare Other | Admitting: Cardiology

## 2021-01-17 ENCOUNTER — Other Ambulatory Visit: Payer: Self-pay

## 2021-01-17 ENCOUNTER — Encounter: Payer: Self-pay | Admitting: Cardiology

## 2021-01-17 VITALS — BP 148/75 | HR 70 | Ht 69.0 in | Wt 184.8 lb

## 2021-01-17 DIAGNOSIS — R0789 Other chest pain: Secondary | ICD-10-CM

## 2021-01-17 DIAGNOSIS — R42 Dizziness and giddiness: Secondary | ICD-10-CM

## 2021-01-17 NOTE — Progress Notes (Signed)
Clinical Summary [name] is a 65 y.o.male seen today as a new consult, referred by Dr Jimmye Norman for the following medical problems.     1. Dizziness - 2/022 [facility] echo LVEF 50%, grade I dd, mild MR - HR low 50s during pcp visit. Has been taking lopressor 50mg  in AM, lowered to 25mg  daily.  - starting 1 month ago was cleaning out basement. Walking upstairs noticed some fatigue. That night while sweeping floor. Felt dizzy, off balance while sweeping. Nospecific feeling in chest. Felt better after 10 min.  - since that time nonspecific chest feeling, different feeling in head  - he reports had a heart monitor with pcp, told he had some fast HRs - limited caffeine. Drinks EtOH 3-4 times a week, mixed drinks. High levels of stress with recent passing of his wife   2. Chest pain - 12/2020 nuclear stress [facility]: no ischemia, large apical/inferior scar, LVEF 43% thus listed as intermediate risk - 12/2020 [facility] echo LVEF 50%, grade I dd, mild MR   Wife passed on 02-21-2023 Past Medical History:  Diagnosis Date  . Borderline diabetes   . Elevated cholesterol   . Ulcerative colitis ([place])      Allergies  Allergen Reactions  . Venomil Mixed Vespid [Mixed Vespid Venom]   . Venomil Wasp Venom [Wasp Venom]   . Penicillins Rash     Current Outpatient Medications  Medication Sig Dispense Refill  . aspirin 81 MG chewable tablet Chew 81 mg by mouth.    . azaTHIOprine (IMURAN) 50 MG tablet Take 200 mg by mouth daily.    Marland Kitchen EPINEPHrine (EPIPEN 2-PAK) 0.3 mg/0.3 mL IJ SOAJ injection Inject 0.3 mLs (0.3 mg total) into the muscle daily as needed. For severe life-threatening allergic reaction 1 Device 3  . fexofenadine (ALLEGRA) 180 MG tablet Take 180 mg by mouth daily.    Marland Kitchen gabapentin (NEURONTIN) 300 MG capsule Take 1 capsule by mouth daily.    Marland Kitchen glipiZIDE (GLUCOTROL) 5 MG tablet Take 5 mg by mouth 2 (two) times daily.    Marland Kitchen losartan-hydrochlorothiazide (HYZAAR) 100-12.5 MG tablet  Take 1 tablet by mouth daily.    . metoprolol tartrate (LOPRESSOR) 50 MG tablet   4  . Multiple Vitamin (THERA) TABS Take 1 tablet by mouth daily.    . simvastatin (ZOCOR) 40 MG tablet Take 1 tablet by mouth daily.    Marland Kitchen zolpidem (AMBIEN) 10 MG tablet Take 10 mg by mouth at bedtime.     No current facility-administered medications for this visit.     Past Surgical History:  Procedure Laterality Date  . BREAST MASS EXCISION Right October 12,2016   Per patient, non malignant  . MEDIAL PARTIAL KNEE REPLACEMENT Right 6 yrs ago     Allergies  Allergen Reactions  . Venomil Mixed Vespid [Mixed Vespid Venom]   . Venomil Wasp Venom [Wasp Venom]   . Penicillins Rash      Family History  Problem Relation Age of Onset  . Alzheimer's disease Mother   . Heart attack Father   . Stroke Father   . Allergic rhinitis Neg Hx   . Angioedema Neg Hx   . Asthma Neg Hx   . Eczema Neg Hx   . Immunodeficiency Neg Hx   . Urticaria Neg Hx      Social History Mr. Althouse reports that he has never smoked. He has never used smokeless tobacco. Mr. Mutch reports no history of alcohol use.  Review of Systems CONSTITUTIONAL: No weight loss, fever, chills, weakness or fatigue.  HEENT: Eyes: No visual loss, blurred vision, double vision or yellow sclerae.No hearing loss, sneezing, congestion, runny nose or sore throat.  SKIN: No rash or itching.  CARDIOVASCULAR: per hpi RESPIRATORY: No shortness of breath, cough or sputum.  GASTROINTESTINAL: No anorexia, nausea, vomiting or diarrhea. No abdominal pain or blood.  GENITOURINARY: No burning on urination, no polyuria NEUROLOGICAL: No headache, dizziness, syncope, paralysis, ataxia, numbness or tingling in the extremities. No change in bowel or bladder control.  MUSCULOSKELETAL: No muscle, back pain, joint pain or stiffness.  LYMPHATICS: No enlarged nodes. No history of splenectomy.  PSYCHIATRIC: No history of depression or anxiety.   ENDOCRINOLOGIC: No reports of sweating, cold or heat intolerance. No polyuria or polydipsia.  Marland Kitchen   Physical Examination Today's Vitals   01/17/21 1124 01/17/21 1126 01/17/21 1127 01/17/21 1129  BP: (!) 150/82 (!) 145/82 130/78 (!) 148/75  Pulse: 61 68 73 70  SpO2:  98% 99%   Weight:      Height:       Body mass index is 27.29 kg/m.  Gen: resting comfortably, no acute distress HEENT: no scleral icterus, pupils equal round and reactive, no palptable cervical adenopathy,  CV: RRR, no m/r/g, no jvd Resp: Clear to auscultation bilaterally GI: abdomen is soft, non-tender, non-distended, normal bowel sounds, no hepatosplenomegaly MSK: extremities are warm, no edema.  Skin: warm, no rash Neuro:  no focal deficits Psych: appropriate affect   Diagnostic Studies  12/2019 [facility] Echo  Summary 1. The left ventricle is upper normal in size with normal wall thickness. 2. The left ventricular systolic function is borderline, LVEF is visually estimated at 50%. 3. There is grade I diastolic dysfunction (impaired relaxation). 4. There is mild mitral valve regurgitation. 5. The left atrium is mildly dilated in size. 6. The right ventricle is normal in size, with normal systolic function.  12/2019 nuclear stress 1. No reversible ischemia. Large scar involving the apex and  inferior wall..   2. Normal left ventricular wall motion. Mild LEFT ventricular  dilatation   3. Left ventricular ejection fraction 43%   4. Non invasive risk stratification*: intermediate (based on low  ejection fraction)     Assessment and Plan  1. Dizziness - benign echo - request results from pcp event monitor - orthostatics today were abnormal with 20 point drop in SBP with standing. Encouraged increased hyration - further recs pending monitor results  2. Chest discomfort - nonspecific feeling in chest with episodes - stress test without ischema, perhaps prior scar - no further workup at this  time. Continue ASA, risk factor modiciation        Arnoldo Lenis, M.D.

## 2021-01-17 NOTE — Patient Instructions (Signed)
Your physician recommends that you schedule a follow-up appointment in: 6 MONTHS WITH DR BRANCH  Your physician recommends that you continue on your current medications as directed. Please refer to the Current Medication list given to you today.  Thank you for choosing [facility]!!    

## 2021-01-26 DIAGNOSIS — R269 Unspecified abnormalities of gait and mobility: Secondary | ICD-10-CM | POA: Diagnosis not present

## 2021-01-26 DIAGNOSIS — R42 Dizziness and giddiness: Secondary | ICD-10-CM | POA: Diagnosis not present

## 2021-02-09 ENCOUNTER — Ambulatory Visit (INDEPENDENT_AMBULATORY_CARE_PROVIDER_SITE_OTHER): Payer: Medicare Other

## 2021-02-09 ENCOUNTER — Other Ambulatory Visit: Payer: Self-pay

## 2021-02-09 DIAGNOSIS — T63441D Toxic effect of venom of bees, accidental (unintentional), subsequent encounter: Secondary | ICD-10-CM | POA: Diagnosis not present

## 2021-02-10 DIAGNOSIS — X32XXXA Exposure to sunlight, initial encounter: Secondary | ICD-10-CM | POA: Diagnosis not present

## 2021-02-10 DIAGNOSIS — D225 Melanocytic nevi of trunk: Secondary | ICD-10-CM | POA: Diagnosis not present

## 2021-02-10 DIAGNOSIS — L821 Other seborrheic keratosis: Secondary | ICD-10-CM | POA: Diagnosis not present

## 2021-02-10 DIAGNOSIS — C44622 Squamous cell carcinoma of skin of right upper limb, including shoulder: Secondary | ICD-10-CM | POA: Diagnosis not present

## 2021-02-10 DIAGNOSIS — L570 Actinic keratosis: Secondary | ICD-10-CM | POA: Diagnosis not present

## 2021-03-02 DIAGNOSIS — C44622 Squamous cell carcinoma of skin of right upper limb, including shoulder: Secondary | ICD-10-CM | POA: Diagnosis not present

## 2021-03-11 DIAGNOSIS — M5136 Other intervertebral disc degeneration, lumbar region: Secondary | ICD-10-CM | POA: Diagnosis not present

## 2021-03-11 DIAGNOSIS — M4726 Other spondylosis with radiculopathy, lumbar region: Secondary | ICD-10-CM | POA: Diagnosis not present

## 2021-03-17 DIAGNOSIS — R7989 Other specified abnormal findings of blood chemistry: Secondary | ICD-10-CM | POA: Diagnosis not present

## 2021-03-17 DIAGNOSIS — K5100 Ulcerative (chronic) pancolitis without complications: Secondary | ICD-10-CM | POA: Diagnosis not present

## 2021-04-12 ENCOUNTER — Ambulatory Visit: Payer: Medicare Other | Admitting: Physician Assistant

## 2021-04-13 ENCOUNTER — Other Ambulatory Visit: Payer: Self-pay

## 2021-04-13 ENCOUNTER — Ambulatory Visit (INDEPENDENT_AMBULATORY_CARE_PROVIDER_SITE_OTHER): Payer: Medicare Other

## 2021-04-13 DIAGNOSIS — T63441D Toxic effect of venom of bees, accidental (unintentional), subsequent encounter: Secondary | ICD-10-CM

## 2021-04-15 DIAGNOSIS — E1165 Type 2 diabetes mellitus with hyperglycemia: Secondary | ICD-10-CM | POA: Diagnosis not present

## 2021-04-15 DIAGNOSIS — K5090 Crohn's disease, unspecified, without complications: Secondary | ICD-10-CM | POA: Diagnosis not present

## 2021-04-15 DIAGNOSIS — Z1329 Encounter for screening for other suspected endocrine disorder: Secondary | ICD-10-CM | POA: Diagnosis not present

## 2021-04-15 DIAGNOSIS — I10 Essential (primary) hypertension: Secondary | ICD-10-CM | POA: Diagnosis not present

## 2021-04-15 DIAGNOSIS — E7800 Pure hypercholesterolemia, unspecified: Secondary | ICD-10-CM | POA: Diagnosis not present

## 2021-04-19 DIAGNOSIS — E1169 Type 2 diabetes mellitus with other specified complication: Secondary | ICD-10-CM | POA: Diagnosis not present

## 2021-04-19 DIAGNOSIS — Z23 Encounter for immunization: Secondary | ICD-10-CM | POA: Diagnosis not present

## 2021-04-19 DIAGNOSIS — K5190 Ulcerative colitis, unspecified, without complications: Secondary | ICD-10-CM | POA: Diagnosis not present

## 2021-04-19 DIAGNOSIS — Z0001 Encounter for general adult medical examination with abnormal findings: Secondary | ICD-10-CM | POA: Diagnosis not present

## 2021-04-19 DIAGNOSIS — I10 Essential (primary) hypertension: Secondary | ICD-10-CM | POA: Diagnosis not present

## 2021-04-19 DIAGNOSIS — E7800 Pure hypercholesterolemia, unspecified: Secondary | ICD-10-CM | POA: Diagnosis not present

## 2021-05-03 DIAGNOSIS — K5190 Ulcerative colitis, unspecified, without complications: Secondary | ICD-10-CM | POA: Diagnosis not present

## 2021-05-03 DIAGNOSIS — I10 Essential (primary) hypertension: Secondary | ICD-10-CM | POA: Diagnosis not present

## 2021-05-03 DIAGNOSIS — E1169 Type 2 diabetes mellitus with other specified complication: Secondary | ICD-10-CM | POA: Diagnosis not present

## 2021-05-31 DIAGNOSIS — R5382 Chronic fatigue, unspecified: Secondary | ICD-10-CM | POA: Diagnosis not present

## 2021-05-31 DIAGNOSIS — E1169 Type 2 diabetes mellitus with other specified complication: Secondary | ICD-10-CM | POA: Diagnosis not present

## 2021-05-31 DIAGNOSIS — I10 Essential (primary) hypertension: Secondary | ICD-10-CM | POA: Diagnosis not present

## 2021-06-08 ENCOUNTER — Other Ambulatory Visit: Payer: Self-pay

## 2021-06-08 ENCOUNTER — Ambulatory Visit (INDEPENDENT_AMBULATORY_CARE_PROVIDER_SITE_OTHER): Payer: Medicare Other | Admitting: *Deleted

## 2021-06-08 DIAGNOSIS — T63441D Toxic effect of venom of bees, accidental (unintentional), subsequent encounter: Secondary | ICD-10-CM | POA: Diagnosis not present

## 2021-06-14 DIAGNOSIS — I10 Essential (primary) hypertension: Secondary | ICD-10-CM | POA: Diagnosis not present

## 2021-06-14 DIAGNOSIS — E1169 Type 2 diabetes mellitus with other specified complication: Secondary | ICD-10-CM | POA: Diagnosis not present

## 2021-06-14 DIAGNOSIS — K5190 Ulcerative colitis, unspecified, without complications: Secondary | ICD-10-CM | POA: Diagnosis not present

## 2021-06-28 DIAGNOSIS — R7989 Other specified abnormal findings of blood chemistry: Secondary | ICD-10-CM | POA: Diagnosis not present

## 2021-06-28 DIAGNOSIS — K5100 Ulcerative (chronic) pancolitis without complications: Secondary | ICD-10-CM | POA: Diagnosis not present

## 2021-06-28 DIAGNOSIS — K8020 Calculus of gallbladder without cholecystitis without obstruction: Secondary | ICD-10-CM | POA: Diagnosis not present

## 2021-06-28 DIAGNOSIS — R945 Abnormal results of liver function studies: Secondary | ICD-10-CM | POA: Diagnosis not present

## 2021-06-29 DIAGNOSIS — K5100 Ulcerative (chronic) pancolitis without complications: Secondary | ICD-10-CM | POA: Diagnosis not present

## 2021-06-29 DIAGNOSIS — R945 Abnormal results of liver function studies: Secondary | ICD-10-CM | POA: Diagnosis not present

## 2021-06-29 DIAGNOSIS — R7989 Other specified abnormal findings of blood chemistry: Secondary | ICD-10-CM | POA: Diagnosis not present

## 2021-07-08 DIAGNOSIS — H524 Presbyopia: Secondary | ICD-10-CM | POA: Diagnosis not present

## 2021-07-08 DIAGNOSIS — H353122 Nonexudative age-related macular degeneration, left eye, intermediate dry stage: Secondary | ICD-10-CM | POA: Diagnosis not present

## 2021-07-13 DIAGNOSIS — K760 Fatty (change of) liver, not elsewhere classified: Secondary | ICD-10-CM | POA: Diagnosis not present

## 2021-07-13 DIAGNOSIS — R7989 Other specified abnormal findings of blood chemistry: Secondary | ICD-10-CM | POA: Diagnosis not present

## 2021-07-13 DIAGNOSIS — K5100 Ulcerative (chronic) pancolitis without complications: Secondary | ICD-10-CM | POA: Diagnosis not present

## 2021-07-13 DIAGNOSIS — K8020 Calculus of gallbladder without cholecystitis without obstruction: Secondary | ICD-10-CM | POA: Diagnosis not present

## 2021-07-19 DIAGNOSIS — R7989 Other specified abnormal findings of blood chemistry: Secondary | ICD-10-CM | POA: Diagnosis not present

## 2021-07-19 DIAGNOSIS — K5100 Ulcerative (chronic) pancolitis without complications: Secondary | ICD-10-CM | POA: Diagnosis not present

## 2021-07-19 DIAGNOSIS — Z79899 Other long term (current) drug therapy: Secondary | ICD-10-CM | POA: Diagnosis not present

## 2021-07-20 DIAGNOSIS — R7989 Other specified abnormal findings of blood chemistry: Secondary | ICD-10-CM | POA: Diagnosis not present

## 2021-07-29 ENCOUNTER — Ambulatory Visit: Payer: Medicare Other | Admitting: Cardiology

## 2021-07-29 ENCOUNTER — Encounter: Payer: Self-pay | Admitting: Cardiology

## 2021-07-29 ENCOUNTER — Other Ambulatory Visit: Payer: Self-pay

## 2021-07-29 VITALS — BP 134/72 | HR 60 | Ht 69.5 in | Wt 186.4 lb

## 2021-07-29 DIAGNOSIS — R42 Dizziness and giddiness: Secondary | ICD-10-CM | POA: Diagnosis not present

## 2021-07-29 DIAGNOSIS — R0789 Other chest pain: Secondary | ICD-10-CM

## 2021-07-29 MED ORDER — SILDENAFIL CITRATE 20 MG PO TABS: ORAL_TABLET | ORAL | 6 refills | Status: AC

## 2021-07-29 NOTE — Progress Notes (Signed)
Clinical Summary Joseph Chase is a 65 y.o.male seen today for follow up of the following medical problems.     1. Dizziness - 2/022 [facility] echo LVEF 50%, grade I dd, mild MR - HR low 50s during pcp visit. Has been taking lopressor '50mg'$  in AM, lowered to '25mg'$  daily.  - starting 1 month ago was cleaning out basement. Walking upstairs noticed some fatigue. That night while sweeping floor. Felt dizzy, off balance while sweeping. Nospecific feeling in chest. Felt better after 10 min.  - since that time nonspecific chest feeling, different feeling in head   - he reports had a heart monitor with pcp, told he had some fast HRs - limited caffeine. Drinks EtOH 3-4 times a week, mixed drinks. High levels of stress with recent passing of his wife  - dizziness has improved since last visit      2. Chest pain - 12/2020 nuclear stress [facility]: no ischemia, large apical/inferior scar, LVEF 43% thus listed as intermediate risk - 12/2020 [facility] echo LVEF 50%, grade I dd, mild MR - no recent chest pains  3. Ulcerative colitis     4. HTN - compliant with meds Past Medical History:  Diagnosis Date   Borderline diabetes    Elevated cholesterol    Ulcerative colitis ([facility])      Allergies  Allergen Reactions   Venomil Mixed Vespid [Mixed Vespid Venom]    Venomil Wasp Venom [Wasp Venom]    Penicillins Rash     Current Outpatient Medications  Medication Sig Dispense Refill   aspirin 81 MG chewable tablet Chew 81 mg by mouth daily.     azaTHIOprine (IMURAN) 50 MG tablet Take 200 mg by mouth daily.     EPINEPHrine (EPIPEN 2-PAK) 0.3 mg/0.3 mL IJ SOAJ injection Inject 0.3 mLs (0.3 mg total) into the muscle daily as needed. For severe life-threatening allergic reaction 1 Device 3   fexofenadine (ALLEGRA) 180 MG tablet Take 180 mg by mouth daily.     gabapentin (NEURONTIN) 300 MG capsule Take 1 capsule by mouth daily.     glipiZIDE (GLUCOTROL) 5 MG tablet Take 5 mg by mouth 2 (two)  times daily.     hydrochlorothiazide (HYDRODIURIL) 25 MG tablet Take 25 mg by mouth daily.     losartan (COZAAR) 100 MG tablet Take 100 mg by mouth daily.     metFORMIN (GLUCOPHAGE-XR) 500 MG 24 hr tablet Take 500-1,000 mg by mouth 2 (two) times daily. 500 mg in the morning and 1000 mg in the evening     metoprolol succinate (TOPROL-XL) 25 MG 24 hr tablet Take 25 mg by mouth daily.     Multiple Vitamin (THERA) TABS Take 1 tablet by mouth daily.     simvastatin (ZOCOR) 40 MG tablet Take 1 tablet by mouth daily.     zolpidem (AMBIEN) 10 MG tablet Take 10 mg by mouth at bedtime.     No current facility-administered medications for this visit.     Past Surgical History:  Procedure Laterality Date   BREAST MASS EXCISION Right October 12,2016   Per patient, non malignant   MEDIAL PARTIAL KNEE REPLACEMENT Right 6 yrs ago     Allergies  Allergen Reactions   Venomil Mixed Vespid [Mixed Vespid Venom]    Venomil Wasp Venom [Wasp Venom]    Penicillins Rash      Family History  Problem Relation Age of Onset   Alzheimer's disease Mother    Heart attack  Father    Stroke Father    Allergic rhinitis Neg Hx    Angioedema Neg Hx    Asthma Neg Hx    Eczema Neg Hx    Immunodeficiency Neg Hx    Urticaria Neg Hx      Social History Mr. Everard reports that he has never smoked. He has never used smokeless tobacco. Mr. Nigg reports no history of alcohol use.   Review of Systems CONSTITUTIONAL: No weight loss, fever, chills, weakness or fatigue.  HEENT: Eyes: No visual loss, blurred vision, double vision or yellow sclerae.No hearing loss, sneezing, congestion, runny nose or sore throat.  SKIN: No rash or itching.  CARDIOVASCULAR: per hpi RESPIRATORY: No shortness of breath, cough or sputum.  GASTROINTESTINAL: No anorexia, nausea, vomiting or diarrhea. No abdominal pain or blood.  GENITOURINARY: No burning on urination, no polyuria NEUROLOGICAL: No headache, dizziness, syncope,  paralysis, ataxia, numbness or tingling in the extremities. No change in bowel or bladder control.  MUSCULOSKELETAL: No muscle, back pain, joint pain or stiffness.  LYMPHATICS: No enlarged nodes. No history of splenectomy.  PSYCHIATRIC: No history of depression or anxiety.  ENDOCRINOLOGIC: No reports of sweating, cold or heat intolerance. No polyuria or polydipsia.  Marland Kitchen   Physical Examination Today's Vitals   07/29/21 1012  BP: 134/72  Pulse: 60  SpO2: 97%  Weight: 186 lb 6.4 oz (84.6 kg)  Height: 5' 9.5" (1.765 m)   Body mass index is 27.13 kg/m.  Gen: resting comfortably, no acute distress HEENT: no scleral icterus, pupils equal round and reactive, no palptable cervical adenopathy,  CV: RRR, no m/r/g no jvd Resp: Clear to auscultation bilaterally GI: abdomen is soft, non-tender, non-distended, normal bowel sounds, no hepatosplenomegaly MSK: extremities are warm, no edema.  Skin: warm, no rash Neuro:  no focal deficits Psych: appropriate affect   Diagnostic Studies  12/2019 [facility] Echo  Summary  1. The left ventricle is upper normal in size with normal wall thickness.  2. The left ventricular systolic function is borderline, LVEF is visually estimated at 50%.  3. There is grade I diastolic dysfunction (impaired relaxation).  4. There is mild mitral valve regurgitation.  5. The left atrium is mildly dilated in size.  6. The right ventricle is normal in size, with normal systolic function.   12/2019 nuclear stress 1. No reversible ischemia. Large scar involving the apex and  inferior wall..   2. Normal left ventricular wall motion. Mild LEFT ventricular  dilatation   3. Left ventricular ejection fraction 43%   4. Non invasive risk stratification*: intermediate (based on low  ejection fraction)    Assessment and Plan  1. Dizziness - benign echo and monitor done prior to our evaluation - was orthostatic at our last visit - symptoms have resolved, continue  regular hydration.    2. Chest pain - stress test without ischema, perhaps prior scar - no recent symptoms -  Continue ASA, risk factor modiciation  3. Erectile dysfunction - given Rx for sildenail  F/u 1 year   Arnoldo Lenis, M.D.

## 2021-07-29 NOTE — Patient Instructions (Signed)
Medication Instructions:  Your physician has recommended you make the following change in your medication:  START Sildenafil 20 mg tablets- take 3-5 tablets 30 minutes before intercourse.   *If you need a refill on your cardiac medications before your next appointment, please call your pharmacy*   Lab Work: None If you have labs (blood work) drawn today and your tests are completely normal, you will receive your results only by: [place] (if you have MyChart) OR A paper copy in the mail If you have any lab test that is abnormal or we need to change your treatment, we will call you to review the results.   Testing/Procedures: None   Follow-Up: At [facility], you and your health needs are our priority.  As part of our continuing mission to provide you with exceptional heart care, we have created designated Provider Care Teams.  These Care Teams include your primary Cardiologist (physician) and Advanced Practice Providers (APPs -  Physician Assistants and Nurse Practitioners) who all work together to provide you with the care you need, when you need it.  We recommend signing up for the patient portal called "MyChart".  Sign up information is provided on this After Visit Summary.  MyChart is used to connect with patients for Virtual Visits (Telemedicine).  Patients are able to view lab/test results, encounter notes, upcoming appointments, etc.  Non-urgent messages can be sent to your provider as well.   To learn more about what you can do with MyChart, go to [email or web address].    Your next appointment:   12 month(s)  The format for your next appointment:   In Person  Provider:   Carlyle Dolly, MD   Other Instructions

## 2021-08-02 DIAGNOSIS — K5190 Ulcerative colitis, unspecified, without complications: Secondary | ICD-10-CM | POA: Diagnosis not present

## 2021-08-02 DIAGNOSIS — I10 Essential (primary) hypertension: Secondary | ICD-10-CM | POA: Diagnosis not present

## 2021-08-02 DIAGNOSIS — E1169 Type 2 diabetes mellitus with other specified complication: Secondary | ICD-10-CM | POA: Diagnosis not present

## 2021-08-02 DIAGNOSIS — E7800 Pure hypercholesterolemia, unspecified: Secondary | ICD-10-CM | POA: Diagnosis not present

## 2021-08-03 ENCOUNTER — Other Ambulatory Visit: Payer: Self-pay

## 2021-08-03 ENCOUNTER — Ambulatory Visit: Payer: Self-pay

## 2021-08-03 ENCOUNTER — Ambulatory Visit (INDEPENDENT_AMBULATORY_CARE_PROVIDER_SITE_OTHER): Payer: Medicare Other

## 2021-08-03 DIAGNOSIS — T63441D Toxic effect of venom of bees, accidental (unintentional), subsequent encounter: Secondary | ICD-10-CM | POA: Diagnosis not present

## 2021-08-17 DIAGNOSIS — Z79899 Other long term (current) drug therapy: Secondary | ICD-10-CM | POA: Diagnosis not present

## 2021-08-17 DIAGNOSIS — R7989 Other specified abnormal findings of blood chemistry: Secondary | ICD-10-CM | POA: Diagnosis not present

## 2021-08-19 ENCOUNTER — Other Ambulatory Visit: Payer: Self-pay | Admitting: Cardiology

## 2021-09-16 DIAGNOSIS — R7989 Other specified abnormal findings of blood chemistry: Secondary | ICD-10-CM | POA: Diagnosis not present

## 2021-09-28 ENCOUNTER — Ambulatory Visit (INDEPENDENT_AMBULATORY_CARE_PROVIDER_SITE_OTHER): Payer: Medicare Other

## 2021-09-28 ENCOUNTER — Other Ambulatory Visit: Payer: Self-pay

## 2021-09-28 DIAGNOSIS — T63441D Toxic effect of venom of bees, accidental (unintentional), subsequent encounter: Secondary | ICD-10-CM

## 2021-10-04 DIAGNOSIS — R7989 Other specified abnormal findings of blood chemistry: Secondary | ICD-10-CM | POA: Diagnosis not present

## 2021-10-04 DIAGNOSIS — K5100 Ulcerative (chronic) pancolitis without complications: Secondary | ICD-10-CM | POA: Diagnosis not present

## 2021-11-01 DIAGNOSIS — I10 Essential (primary) hypertension: Secondary | ICD-10-CM | POA: Diagnosis not present

## 2021-11-01 DIAGNOSIS — E1169 Type 2 diabetes mellitus with other specified complication: Secondary | ICD-10-CM | POA: Diagnosis not present

## 2021-11-01 DIAGNOSIS — K5190 Ulcerative colitis, unspecified, without complications: Secondary | ICD-10-CM | POA: Diagnosis not present

## 2021-11-23 ENCOUNTER — Ambulatory Visit (INDEPENDENT_AMBULATORY_CARE_PROVIDER_SITE_OTHER): Payer: Medicare Other

## 2021-11-23 ENCOUNTER — Other Ambulatory Visit: Payer: Self-pay

## 2021-11-23 DIAGNOSIS — T63441D Toxic effect of venom of bees, accidental (unintentional), subsequent encounter: Secondary | ICD-10-CM

## 2021-12-14 DIAGNOSIS — R7989 Other specified abnormal findings of blood chemistry: Secondary | ICD-10-CM | POA: Diagnosis not present

## 2021-12-19 DIAGNOSIS — D225 Melanocytic nevi of trunk: Secondary | ICD-10-CM | POA: Diagnosis not present

## 2021-12-19 DIAGNOSIS — Z1283 Encounter for screening for malignant neoplasm of skin: Secondary | ICD-10-CM | POA: Diagnosis not present

## 2021-12-19 DIAGNOSIS — L570 Actinic keratosis: Secondary | ICD-10-CM | POA: Diagnosis not present

## 2021-12-19 DIAGNOSIS — Z85828 Personal history of other malignant neoplasm of skin: Secondary | ICD-10-CM | POA: Diagnosis not present

## 2021-12-19 DIAGNOSIS — X32XXXD Exposure to sunlight, subsequent encounter: Secondary | ICD-10-CM | POA: Diagnosis not present

## 2021-12-19 DIAGNOSIS — L820 Inflamed seborrheic keratosis: Secondary | ICD-10-CM | POA: Diagnosis not present

## 2021-12-19 DIAGNOSIS — Z08 Encounter for follow-up examination after completed treatment for malignant neoplasm: Secondary | ICD-10-CM | POA: Diagnosis not present

## 2021-12-21 DIAGNOSIS — K5100 Ulcerative (chronic) pancolitis without complications: Secondary | ICD-10-CM | POA: Diagnosis not present

## 2021-12-21 DIAGNOSIS — R7989 Other specified abnormal findings of blood chemistry: Secondary | ICD-10-CM | POA: Diagnosis not present

## 2021-12-21 DIAGNOSIS — K5909 Other constipation: Secondary | ICD-10-CM | POA: Diagnosis not present

## 2022-01-10 DIAGNOSIS — H353122 Nonexudative age-related macular degeneration, left eye, intermediate dry stage: Secondary | ICD-10-CM | POA: Diagnosis not present

## 2022-01-18 ENCOUNTER — Other Ambulatory Visit: Payer: Self-pay

## 2022-01-18 ENCOUNTER — Ambulatory Visit (INDEPENDENT_AMBULATORY_CARE_PROVIDER_SITE_OTHER): Payer: Medicare Other

## 2022-01-18 DIAGNOSIS — T63441D Toxic effect of venom of bees, accidental (unintentional), subsequent encounter: Secondary | ICD-10-CM

## 2022-03-15 ENCOUNTER — Ambulatory Visit: Payer: Medicare Other

## 2022-03-17 ENCOUNTER — Ambulatory Visit (INDEPENDENT_AMBULATORY_CARE_PROVIDER_SITE_OTHER): Payer: Medicare Other

## 2022-03-17 DIAGNOSIS — T63441D Toxic effect of venom of bees, accidental (unintentional), subsequent encounter: Secondary | ICD-10-CM | POA: Diagnosis not present

## 2022-05-12 ENCOUNTER — Ambulatory Visit (INDEPENDENT_AMBULATORY_CARE_PROVIDER_SITE_OTHER): Payer: Medicare Other

## 2022-05-12 DIAGNOSIS — T63441D Toxic effect of venom of bees, accidental (unintentional), subsequent encounter: Secondary | ICD-10-CM | POA: Diagnosis not present

## 2022-05-12 MED ORDER — EPINEPHRINE 0.3 MG/0.3ML IJ SOAJ: 0.3000 mg | Freq: Every day | INTRAMUSCULAR | 3 refills | Status: DC | PRN

## 2022-05-19 DIAGNOSIS — R3915 Urgency of urination: Secondary | ICD-10-CM | POA: Diagnosis not present

## 2022-05-19 DIAGNOSIS — N200 Calculus of kidney: Secondary | ICD-10-CM | POA: Diagnosis not present

## 2022-05-25 DIAGNOSIS — K5090 Crohn's disease, unspecified, without complications: Secondary | ICD-10-CM | POA: Diagnosis not present

## 2022-05-25 DIAGNOSIS — E559 Vitamin D deficiency, unspecified: Secondary | ICD-10-CM | POA: Diagnosis not present

## 2022-05-25 DIAGNOSIS — Z1329 Encounter for screening for other suspected endocrine disorder: Secondary | ICD-10-CM | POA: Diagnosis not present

## 2022-05-25 DIAGNOSIS — I10 Essential (primary) hypertension: Secondary | ICD-10-CM | POA: Diagnosis not present

## 2022-05-25 DIAGNOSIS — E1169 Type 2 diabetes mellitus with other specified complication: Secondary | ICD-10-CM | POA: Diagnosis not present

## 2022-05-25 DIAGNOSIS — E7800 Pure hypercholesterolemia, unspecified: Secondary | ICD-10-CM | POA: Diagnosis not present

## 2022-05-25 DIAGNOSIS — E1165 Type 2 diabetes mellitus with hyperglycemia: Secondary | ICD-10-CM | POA: Diagnosis not present

## 2022-05-29 DIAGNOSIS — L820 Inflamed seborrheic keratosis: Secondary | ICD-10-CM | POA: Diagnosis not present

## 2022-05-29 DIAGNOSIS — L570 Actinic keratosis: Secondary | ICD-10-CM | POA: Diagnosis not present

## 2022-05-29 DIAGNOSIS — X32XXXD Exposure to sunlight, subsequent encounter: Secondary | ICD-10-CM | POA: Diagnosis not present

## 2022-05-30 DIAGNOSIS — E7800 Pure hypercholesterolemia, unspecified: Secondary | ICD-10-CM | POA: Diagnosis not present

## 2022-05-30 DIAGNOSIS — Z13220 Encounter for screening for lipoid disorders: Secondary | ICD-10-CM | POA: Diagnosis not present

## 2022-05-30 DIAGNOSIS — E1169 Type 2 diabetes mellitus with other specified complication: Secondary | ICD-10-CM | POA: Diagnosis not present

## 2022-05-30 DIAGNOSIS — Z1329 Encounter for screening for other suspected endocrine disorder: Secondary | ICD-10-CM | POA: Diagnosis not present

## 2022-05-31 DIAGNOSIS — I10 Essential (primary) hypertension: Secondary | ICD-10-CM | POA: Diagnosis not present

## 2022-05-31 DIAGNOSIS — K5190 Ulcerative colitis, unspecified, without complications: Secondary | ICD-10-CM | POA: Diagnosis not present

## 2022-05-31 DIAGNOSIS — E1169 Type 2 diabetes mellitus with other specified complication: Secondary | ICD-10-CM | POA: Diagnosis not present

## 2022-06-09 DIAGNOSIS — K5100 Ulcerative (chronic) pancolitis without complications: Secondary | ICD-10-CM | POA: Diagnosis not present

## 2022-06-12 DIAGNOSIS — K5100 Ulcerative (chronic) pancolitis without complications: Secondary | ICD-10-CM | POA: Diagnosis not present

## 2022-06-21 DIAGNOSIS — K5100 Ulcerative (chronic) pancolitis without complications: Secondary | ICD-10-CM | POA: Diagnosis not present

## 2022-06-21 DIAGNOSIS — Z79899 Other long term (current) drug therapy: Secondary | ICD-10-CM | POA: Diagnosis not present

## 2022-06-30 DIAGNOSIS — K5730 Diverticulosis of large intestine without perforation or abscess without bleeding: Secondary | ICD-10-CM | POA: Diagnosis not present

## 2022-06-30 DIAGNOSIS — K6389 Other specified diseases of intestine: Secondary | ICD-10-CM | POA: Diagnosis not present

## 2022-06-30 DIAGNOSIS — K633 Ulcer of intestine: Secondary | ICD-10-CM | POA: Diagnosis not present

## 2022-06-30 DIAGNOSIS — K523 Indeterminate colitis: Secondary | ICD-10-CM | POA: Diagnosis not present

## 2022-07-07 ENCOUNTER — Ambulatory Visit: Payer: Medicare Other

## 2022-07-07 ENCOUNTER — Ambulatory Visit: Payer: Medicare Other | Admitting: Allergy & Immunology

## 2022-07-10 ENCOUNTER — Encounter: Payer: Self-pay | Admitting: Allergy

## 2022-07-10 ENCOUNTER — Ambulatory Visit (INDEPENDENT_AMBULATORY_CARE_PROVIDER_SITE_OTHER): Payer: Medicare Other | Admitting: Allergy

## 2022-07-10 ENCOUNTER — Ambulatory Visit: Payer: Medicare Other

## 2022-07-10 VITALS — BP 130/70 | HR 71 | Temp 98.7°F | Resp 16 | Ht 68.0 in | Wt 180.1 lb

## 2022-07-10 DIAGNOSIS — T63441D Toxic effect of venom of bees, accidental (unintentional), subsequent encounter: Secondary | ICD-10-CM | POA: Diagnosis not present

## 2022-07-10 NOTE — Progress Notes (Signed)
Follow-up Note  RE: Joseph Chase MRN: [id number] DOB: 1956/01/08 Date of Office Visit: 07/10/2022   History of present illness: Joseph Chase is a 66 y.o. male presenting today for follow-up of hymenoptera allergy.  He was last seen in the office on 10/23/2018 by Dr. Ernst Bowler.  He is on venom immunotherapy at maintenance 8-week injections.  He states since his last visit he likely had 1 or 2 sting's that he believes were wasp and that the reaction was much milder.  He states he had swelling and pain in remembers he did take Benadryl but did not need to use his epinephrine device.  He does have a up-to-date epinephrine device at this time.  He is tolerating venom immunotherapy well without any large local or systemic reactions. He has not had any new health changes, surgeries or hospitalizations.  He does state he is currently in a colitis flare.  Review of systems: Review of Systems  Constitutional: Negative.   HENT: Negative.    Eyes: Negative.   Respiratory: Negative.    Cardiovascular: Negative.   Gastrointestinal:        Colitis flare  Musculoskeletal: Negative.   Skin: Negative.   Allergic/Immunologic: Negative.   Neurological: Negative.      All other systems negative unless noted above in HPI  Past medical/social/surgical/family history have been reviewed and are unchanged unless specifically indicated below.  No changes  Medication List: Current Outpatient Medications  Medication Sig Dispense Refill   azaTHIOprine (IMURAN) 50 MG tablet Take 200 mg by mouth daily.     co-enzyme Q-10 30 MG capsule Take 30 mg by mouth daily.     EPINEPHrine (EPIPEN 2-PAK) 0.3 mg/0.3 mL IJ SOAJ injection Inject 0.3 mg into the muscle daily as needed. For severe life-threatening allergic reaction 1 each 3   fexofenadine (ALLEGRA) 180 MG tablet Take 180 mg by mouth daily.     gabapentin (NEURONTIN) 300 MG capsule Take 1 capsule by mouth daily.     glipiZIDE (GLUCOTROL) 5 MG  tablet Take 5 mg by mouth 2 (two) times daily.     hydrochlorothiazide (HYDRODIURIL) 25 MG tablet Take 25 mg by mouth daily.     losartan (COZAAR) 100 MG tablet Take 100 mg by mouth daily.     mesalamine (CANASA) 1000 MG suppository Place 1,000 mg rectally at bedtime.     metFORMIN (GLUCOPHAGE-XR) 500 MG 24 hr tablet Take 500-1,000 mg by mouth 2 (two) times daily. 500 mg in the morning and 1000 mg in the evening     metoprolol succinate (TOPROL-XL) 25 MG 24 hr tablet Take 75 mg by mouth daily.     Multiple Vitamins-Minerals (PRESERVISION AREDS 2 PO) Take by mouth in the morning and at bedtime.     predniSONE (DELTASONE) 10 MG tablet Take 10 mg by mouth daily.     rosuvastatin (CRESTOR) 10 MG tablet Take by mouth.     zolpidem (AMBIEN) 10 MG tablet Take 10 mg by mouth at bedtime.     aspirin 81 MG chewable tablet Chew 81 mg by mouth daily. (Patient not taking: Reported on 07/10/2022)     Multiple Vitamin (THERA) TABS Take 1 tablet by mouth daily. (Patient not taking: Reported on 07/10/2022)     sildenafil (REVATIO) 20 MG tablet Take 3-5 tablets 30 minutes before intercourse. (Patient not taking: Reported on 07/10/2022) 30 tablet 6   No current facility-administered medications for this visit.     Known medication allergies: Allergies  Allergen Reactions   Venomil Mixed Vespid [Mixed Vespid Venom]    Venomil Wasp Venom [Wasp Venom]    Penicillins Rash     Physical examination: Blood pressure 130/70, pulse 71, temperature 98.7 F (37.1 C), resp. rate 16, height '5\' 8"'$  (1.727 m), weight 180 lb 2 oz (81.7 kg), SpO2 96 %.  General: Alert, interactive, in no acute distress. HEENT: PERRLA, TMs pearly gray, turbinates non-edematous without discharge, post-pharynx non erythematous. Neck: Supple without lymphadenopathy. Lungs: Clear to auscultation without wheezing, rhonchi or rales. {no increased work of breathing. CV: Normal S1, S2 without murmurs. Abdomen: Nondistended, nontender. Skin: Warm  and dry, without lesions or rashes. Extremities:  No clubbing, cyanosis or edema. Neuro:   Grossly intact.  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  Discussed with him that maintenance dosing can be between 8 to 12 weeks.  Will defer to his primary allergist in regards to if he will continue at 8 weeks or be spread out even further. - Have access to your epinephrine device  Return in about 1 year or sooner if needed  I appreciate the opportunity to take part in Joseph Chase's care. Please do not hesitate to contact me with questions.  Sincerely,   Prudy Feeler, MD Allergy/Immunology [facility]

## 2022-07-10 NOTE — Patient Instructions (Addendum)
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

## 2022-07-12 DIAGNOSIS — I10 Essential (primary) hypertension: Secondary | ICD-10-CM | POA: Diagnosis not present

## 2022-07-12 DIAGNOSIS — K5190 Ulcerative colitis, unspecified, without complications: Secondary | ICD-10-CM | POA: Diagnosis not present

## 2022-07-12 DIAGNOSIS — E1169 Type 2 diabetes mellitus with other specified complication: Secondary | ICD-10-CM | POA: Diagnosis not present

## 2022-07-12 DIAGNOSIS — E7849 Other hyperlipidemia: Secondary | ICD-10-CM | POA: Diagnosis not present

## 2022-07-19 DIAGNOSIS — Z79899 Other long term (current) drug therapy: Secondary | ICD-10-CM | POA: Diagnosis not present

## 2022-08-02 NOTE — Progress Notes (Signed)
I discussed with Dr. Nelva Bush, who saw him last. I agree that we could space to every 10 weeks for 3 doses and then then to every 12 weeks indefinitely thereafter. He has been on venom immunotherapy for years.   Joseph Marvel, MD [facility] [place]

## 2022-08-14 NOTE — Progress Notes (Signed)
Please contact patient on the schedule change.   Thanks

## 2022-08-15 NOTE — Progress Notes (Signed)
Lm on pts voicemail explaining the change and to call if he had any questions will note it in the flowsheet

## 2022-08-16 DIAGNOSIS — R7989 Other specified abnormal findings of blood chemistry: Secondary | ICD-10-CM | POA: Diagnosis not present

## 2022-08-31 ENCOUNTER — Other Ambulatory Visit: Payer: Self-pay | Admitting: Cardiology

## 2022-09-05 DIAGNOSIS — R7989 Other specified abnormal findings of blood chemistry: Secondary | ICD-10-CM | POA: Diagnosis not present

## 2022-09-05 DIAGNOSIS — Z79899 Other long term (current) drug therapy: Secondary | ICD-10-CM | POA: Diagnosis not present

## 2022-09-05 DIAGNOSIS — K5100 Ulcerative (chronic) pancolitis without complications: Secondary | ICD-10-CM | POA: Diagnosis not present

## 2022-09-06 ENCOUNTER — Ambulatory Visit (INDEPENDENT_AMBULATORY_CARE_PROVIDER_SITE_OTHER): Payer: Medicare Other

## 2022-09-06 DIAGNOSIS — T63441D Toxic effect of venom of bees, accidental (unintentional), subsequent encounter: Secondary | ICD-10-CM | POA: Diagnosis not present

## 2022-09-14 DIAGNOSIS — H40051 Ocular hypertension, right eye: Secondary | ICD-10-CM | POA: Diagnosis not present

## 2022-09-15 DIAGNOSIS — M9901 Segmental and somatic dysfunction of cervical region: Secondary | ICD-10-CM | POA: Diagnosis not present

## 2022-09-15 DIAGNOSIS — S134XXA Sprain of ligaments of cervical spine, initial encounter: Secondary | ICD-10-CM | POA: Diagnosis not present

## 2022-09-15 DIAGNOSIS — S338XXA Sprain of other parts of lumbar spine and pelvis, initial encounter: Secondary | ICD-10-CM | POA: Diagnosis not present

## 2022-09-15 DIAGNOSIS — M9903 Segmental and somatic dysfunction of lumbar region: Secondary | ICD-10-CM | POA: Diagnosis not present

## 2022-09-15 DIAGNOSIS — S233XXA Sprain of ligaments of thoracic spine, initial encounter: Secondary | ICD-10-CM | POA: Diagnosis not present

## 2022-09-15 DIAGNOSIS — M9902 Segmental and somatic dysfunction of thoracic region: Secondary | ICD-10-CM | POA: Diagnosis not present

## 2022-10-11 ENCOUNTER — Observation Stay (HOSPITAL_COMMUNITY)
Admission: EM | Admit: 2022-10-11 | Discharge: 2022-10-12 | Disposition: A | Discharge status: Home / Self Care | Payer: Medicare Other | Attending: General Surgery | Admitting: General Surgery

## 2022-10-11 ENCOUNTER — Encounter (HOSPITAL_COMMUNITY): Payer: Self-pay | Admitting: *Deleted

## 2022-10-11 ENCOUNTER — Other Ambulatory Visit: Payer: Self-pay

## 2022-10-11 DIAGNOSIS — K3580 Unspecified acute appendicitis: Secondary | ICD-10-CM | POA: Diagnosis not present

## 2022-10-11 DIAGNOSIS — Z96651 Presence of right artificial knee joint: Secondary | ICD-10-CM | POA: Diagnosis not present

## 2022-10-11 DIAGNOSIS — R1011 Right upper quadrant pain: Secondary | ICD-10-CM | POA: Diagnosis present

## 2022-10-11 DIAGNOSIS — K353 Acute appendicitis with localized peritonitis, without perforation or gangrene: Secondary | ICD-10-CM | POA: Diagnosis not present

## 2022-10-11 DIAGNOSIS — R109 Unspecified abdominal pain: Secondary | ICD-10-CM | POA: Diagnosis not present

## 2022-10-11 DIAGNOSIS — K37 Unspecified appendicitis: Secondary | ICD-10-CM | POA: Diagnosis not present

## 2022-10-11 DIAGNOSIS — K3532 Acute appendicitis with perforation and localized peritonitis, without abscess: Principal | ICD-10-CM | POA: Insufficient documentation

## 2022-10-11 DIAGNOSIS — R7309 Other abnormal glucose: Secondary | ICD-10-CM | POA: Diagnosis not present

## 2022-10-11 DIAGNOSIS — R1031 Right lower quadrant pain: Secondary | ICD-10-CM | POA: Diagnosis not present

## 2022-10-11 DIAGNOSIS — I700 Atherosclerosis of aorta: Secondary | ICD-10-CM | POA: Diagnosis not present

## 2022-10-11 HISTORY — DX: Essential (primary) hypertension: I10

## 2022-10-11 HISTORY — DX: Type 2 diabetes mellitus without complications: E11.9

## 2022-10-11 LAB — BASIC METABOLIC PANEL
Anion gap: 13 (ref 5–15)
BUN: 19 mg/dL (ref 8–23)
CO2: 26 mmol/L (ref 22–32)
Calcium: 9.4 mg/dL (ref 8.9–10.3)
Chloride: 101 mmol/L (ref 98–111)
Creatinine, Ser: 1.23 mg/dL (ref 0.61–1.24)
GFR, Estimated: >60 mL/min (ref >60)
Glucose, Bld: 105 mg/dL — ABNORMAL HIGH (ref 70–99)
Potassium: 3.7 mmol/L (ref 3.5–5.1)
Sodium: 140 mmol/L (ref 135–145)

## 2022-10-11 LAB — CBC WITH DIFFERENTIAL/PLATELET
Abs Immature Granulocytes: 0.05 10*3/uL (ref 0.00–0.07)
Basophils Absolute: 0.1 10*3/uL (ref 0.0–0.1)
Basophils Relative: 1 %
Eosinophils Absolute: 0.2 10*3/uL (ref 0.0–0.5)
Eosinophils Relative: 2 %
HCT: 43.2 % (ref 39.0–52.0)
Hemoglobin: 15.1 g/dL (ref 13.0–17.0)
Immature Granulocytes: 1 %
Lymphocytes Relative: 17 %
Lymphs Abs: 1.6 10*3/uL (ref 0.7–4.0)
MCH: 31.2 pg (ref 26.0–34.0)
MCHC: 35 g/dL (ref 30.0–36.0)
MCV: 89.3 fL (ref 80.0–100.0)
Monocytes Absolute: 0.8 10*3/uL (ref 0.1–1.0)
Monocytes Relative: 9 %
Neutro Abs: 6.7 10*3/uL (ref 1.7–7.7)
Neutrophils Relative %: 70 %
Platelets: 186 10*3/uL (ref 150–400)
RBC: 4.84 MIL/uL (ref 4.22–5.81)
RDW: 13 % (ref 11.5–15.5)
WBC: 9.3 10*3/uL (ref 4.0–10.5)
nRBC: 0 % (ref 0.0–0.2)

## 2022-10-11 NOTE — ED Provider Triage Note (Signed)
Emergency Medicine Provider Triage Evaluation Note  Joseph Chase [facility] , a 66 y.o. male  was evaluated in triage.  Pt complains of lower abdominal pain that began on Friday evening he states he has had intermittent/waxing and waning pain but has been become constant and more severe he went to [facility] and had a CT abdomen pelvis that showed acute appendicitis without perforation.  He states he feels somewhat improved now he states he did not want to have surgery at [facility] because he does not have confidence in this hospital and so he came to the emergency room at [facility].  He also had CBC and CMP done no leukocytosis there.  Not on any anticoagulation.  History of ulcerative colitis.  Last p.o. intake was 12 noon  Review of Systems  Positive: Lower abdominal pain Negative: Fever  Physical Exam  BP (!) 153/73   Pulse 71   Temp 98.3 F (36.8 C)   Resp 18   Ht 5' 9.5" (1.765 m)   Wt 81.6 kg   SpO2 95%   BMI 26.20 kg/m  Gen:   Awake, no distress   Resp:  Normal effort  MSK:   Moves extremities without difficulty  Other:  RLQ w mild TTP.   Medical Decision Making  Medically screening exam initiated at 8:50 PM.  Appropriate orders placed.  Demeco Chase [facility] was informed that the remainder of the evaluation will be completed by another provider, this initial triage assessment does not replace that evaluation, and the importance of remaining in the ED until their evaluation is complete.  CT already completed.  IMPRESSION: 1. Acute, unruptured appendicitis. 2. Moderate coronary artery calcification. 3. Cholelithiasis. 4. Moderate descending and sigmoid colonic diverticulosis without superimposed acute inflammatory change. 5. Moderate prostatic enlargement. 6. Aortic Atherosclerosis (ICD10-I70.0).    Labs w creat 1.31 BUN 24 Bili 1.8  No luekocytosis / anemia on CBC     Joseph Chase, [place] 10/11/22 2052

## 2022-10-11 NOTE — ED Provider Notes (Signed)
[facility] Emergency Department Provider Note MRN:  [id number]  Arrival date & time: 10/12/22     Chief Complaint   Abdominal Pain   History of Present Illness   Joseph Chase is a 66 y.o. year-old male presents to the ED with chief complaint of right lower quadrant pain.  He states that symptoms started on Sunday night and have gradually worsened.  Saw his PCP, who ordered outpatient CT which resulted with acute appendicitis today.  The patient was notified and came to the emergency department for treatment.  He complains of moderate right lower quadrant and periumbilical pain.  States that he feels constipated.  He denies fever or vomiting.  History provided by patient.   Review of Systems  Pertinent positive and negative review of systems noted in HPI.    Physical Exam   Vitals:   10/11/22 2350 10/12/22 0146  BP: (!) 151/89 124/66  Pulse: 68 70  Resp: 15 14  Temp:  98.3 F (36.8 C)  SpO2: 98% 98%    CONSTITUTIONAL:  non toxic-appearing, NAD NEURO:  Alert and oriented x 3, CN 3-12 grossly intact EYES:  eyes equal and reactive ENT/NECK:  Supple, no stridor  CARDIO:  normal rate, regular rhythm, appears well-perfused  PULM:  No respiratory distress, CTAB GI/GU:  non-distended, RLQ TTP MSK/SPINE:  No gross deformities, no edema, moves all extremities  SKIN:  no rash, atraumatic   *Additional and/or pertinent findings included in MDM below  Diagnostic and Interventional Summary    EKG Interpretation  Date/Time:    Ventricular Rate:    PR Interval:    QRS Duration:   QT Interval:    QTC Calculation:   R Axis:     Text Interpretation:         Labs Reviewed  BASIC METABOLIC PANEL - Abnormal; Notable for the following components:      Result Value   Glucose, Bld 105 (*)    All other components within normal limits  CBG MONITORING, ED - Abnormal; Notable for the following components:   Glucose-Capillary 215 (*)    All other  components within normal limits  CBC WITH DIFFERENTIAL/PLATELET  HIV ANTIBODY (ROUTINE TESTING W REFLEX)    No orders to display    Medications  enoxaparin (LOVENOX) injection 40 mg (has no administration in time range)  cefTRIAXone (ROCEPHIN) 2 g in sodium chloride 0.9 % 100 mL IVPB (2 g Intravenous New Bag/Given 10/12/22 0211)    And  metroNIDAZOLE (FLAGYL) IVPB 500 mg (has no administration in time range)  acetaminophen (TYLENOL) tablet 1,000 mg (1,000 mg Oral Given 10/12/22 0204)  ibuprofen (ADVIL) tablet 600 mg (has no administration in time range)  traMADol (ULTRAM) tablet 50 mg (has no administration in time range)  HYDROmorphone (DILAUDID) injection 0.5 mg (has no administration in time range)  diphenhydrAMINE (BENADRYL) 12.5 MG/5ML elixir 12.5 mg (has no administration in time range)    Or  diphenhydrAMINE (BENADRYL) injection 12.5 mg (has no administration in time range)  docusate sodium (COLACE) capsule 100 mg (has no administration in time range)  ondansetron (ZOFRAN-ODT) disintegrating tablet 4 mg (has no administration in time range)    Or  ondansetron (ZOFRAN) injection 4 mg (has no administration in time range)  simethicone (MYLICON) chewable tablet 40 mg (has no administration in time range)  hydrALAZINE (APRESOLINE) injection 10 mg (has no administration in time range)  hydrochlorothiazide (HYDRODIURIL) tablet 25 mg (has no administration in time range)  losartan (COZAAR)  tablet 100 mg (has no administration in time range)  metoprolol succinate (TOPROL-XL) 24 hr tablet 75 mg (has no administration in time range)  rosuvastatin (CRESTOR) tablet 10 mg (has no administration in time range)  zolpidem (AMBIEN) tablet 10 mg (10 mg Oral Given 10/12/22 0203)  predniSONE (DELTASONE) tablet 10 mg (has no administration in time range)  mesalamine (CANASA) suppository 1,000 mg (has no administration in time range)  azaTHIOprine (IMURAN) tablet 200 mg (has no administration in time  range)  gabapentin (NEURONTIN) capsule 300 mg (has no administration in time range)  loratadine (CLARITIN) tablet 10 mg (has no administration in time range)  insulin aspart (novoLOG) injection 0-15 Units (has no administration in time range)  insulin aspart (novoLOG) injection 0-5 Units (2 Units Subcutaneous Given 10/12/22 0205)  lactated ringers infusion (has no administration in time range)     Procedures  /  Critical Care Procedures  ED Course and Medical Decision Making  I have reviewed the triage vital signs, the nursing notes, and pertinent available records from the EMR.  Social Determinants Affecting Complexity of Care: Patient has no clinically significant social determinants affecting this chief complaint..   ED Course:    Medical Decision Making Patient here with RLQ pain.  Confirmed appendicitis on outpatient CT.  Report and labs available in care-everywhere.  Patient has CD with images.  Repeating labs.  Amount and/or Complexity of Data Reviewed Labs: ordered.  Risk Decision regarding hospitalization.     Consultants: I discussed the case with Dr. Dema Severin, who will see the patient.  Requests that images be powershared with our system.  Our ED secretaries are working on this.   Treatment and Plan: Patient's exam and diagnostic results are concerning for appendicitis.  Feel that patient will need admission to the hospital for further treatment and evaluation.    Final Clinical Impressions(s) / ED Diagnoses     ICD-10-CM   1. Acute appendicitis with localized peritonitis, without perforation or abscess, unspecified whether gangrene present  K35.30       ED Discharge Orders     None         Discharge Instructions Discussed with and Provided to Patient:   Discharge Instructions   None      Montine Circle, PA-C 10/12/22 0214    Davonna Belling, MD 10/14/22 [phone]

## 2022-10-11 NOTE — H&P (Signed)
CC: RLQ pain  HPI: Joseph Chase is an 66 y.o. male with reported history of ulcerative colitis followed through [facility] and maintained on azathioprine who presented to the emergency department with right lower quad abdominal pain that began Saturday night.  Has progressed steadily over time.  No reported fever/chills/nausea/vomiting.  He has had some looser stool but had taken some MiraLAX and additionally the oral contrast.  No blood in his stool.  CT A/P done 10/11/2022 and viewable through PACS was read by [facility] radiology Dr. Christa See -dilated hyperemic appendix with extensive periappendiceal stranding in keeping with changes of acute unruptured appendicitis.   Past Medical History:  Diagnosis Date   Borderline diabetes    Elevated cholesterol    Ulcerative colitis [facility])     Past Surgical History:  Procedure Laterality Date   BREAST MASS EXCISION Right October 12,2016   Per patient, non malignant   MEDIAL PARTIAL KNEE REPLACEMENT Right 6 yrs ago    Family History  Problem Relation Age of Onset   Alzheimer's disease Mother    Heart attack Father    Stroke Father    Allergic rhinitis Neg Hx    Angioedema Neg Hx    Asthma Neg Hx    Eczema Neg Hx    Immunodeficiency Neg Hx    Urticaria Neg Hx     Social:  reports that he has never smoked. He has never used smokeless tobacco. He reports that he does not drink alcohol and does not use drugs.  Allergies:  Allergies  Allergen Reactions   Venomil Mixed Vespid [Mixed Vespid Venom]    Venomil Wasp Venom [Wasp Venom]    Penicillins Rash    Medications: I have reviewed the patient's current medications.  Results for orders placed or performed during the hospital encounter of 10/11/22 (from the past 48 hour(s))  CBC with Differential     Status: None   Collection Time: 10/11/22 10:20 PM  Result Value Ref Range   WBC 9.3 4.0 - 10.5 K/uL   RBC 4.84 4.22 - 5.81 MIL/uL   Hemoglobin 15.1 13.0 - 17.0 g/dL   HCT 43.2  39.0 - 52.0 %   MCV 89.3 80.0 - 100.0 fL   MCH 31.2 26.0 - 34.0 pg   MCHC 35.0 30.0 - 36.0 g/dL   RDW 13.0 11.5 - 15.5 %   Platelets 186 150 - 400 K/uL   nRBC 0.0 0.0 - 0.2 %   Neutrophils Relative % 70 %   Neutro Abs 6.7 1.7 - 7.7 K/uL   Lymphocytes Relative 17 %   Lymphs Abs 1.6 0.7 - 4.0 K/uL   Monocytes Relative 9 %   Monocytes Absolute 0.8 0.1 - 1.0 K/uL   Eosinophils Relative 2 %   Eosinophils Absolute 0.2 0.0 - 0.5 K/uL   Basophils Relative 1 %   Basophils Absolute 0.1 0.0 - 0.1 K/uL   Immature Granulocytes 1 %   Abs Immature Granulocytes 0.05 0.00 - 0.07 K/uL    Comment: Performed at [facility], [address] [place], [place] [address]  Basic metabolic panel     Status: Abnormal   Collection Time: 10/11/22 10:20 PM  Result Value Ref Range   Sodium 140 135 - 145 mmol/L   Potassium 3.7 3.5 - 5.1 mmol/L   Chloride 101 98 - 111 mmol/L   CO2 26 22 - 32 mmol/L   Glucose, Bld 105 (H) 70 - 99 mg/dL    Comment: Glucose reference range  applies only to samples taken after fasting for at least 8 hours.   BUN 19 8 - 23 mg/dL   Creatinine, Ser 1.23 0.61 - 1.24 mg/dL   Calcium 9.4 8.9 - 10.3 mg/dL   GFR, Estimated >60 >60 mL/min    Comment: (NOTE) Calculated using the CKD-EPI Creatinine Equation (2021)    Anion gap 13 5 - 15    Comment: Performed at [facility] [address], [place], [place] [address]    No results found.  ROS - all of the below systems have been reviewed with the patient and positives are indicated with bold text General: chills, fever or night sweats Eyes: blurry vision or double vision ENT: epistaxis or sore throat Allergy/Immunology: itchy/watery eyes or nasal congestion Hematologic/Lymphatic: bleeding problems, blood clots or swollen lymph nodes Endocrine: temperature intolerance or unexpected weight changes Breast: new or changing breast lumps or nipple discharge Resp: cough, shortness of breath, or wheezing CV: chest pain or  dyspnea on exertion GI: as per HPI GU: dysuria, trouble voiding, or hematuria MSK: joint pain or joint stiffness Neuro: TIA or stroke symptoms Derm: pruritus and skin lesion changes Psych: anxiety and depression  PE Blood pressure (!) 153/73, pulse 71, temperature 98.3 F (36.8 C), resp. rate 18, height 5' 9.5" (1.765 m), weight 81.6 kg, SpO2 95 %. Constitutional: NAD; conversant Eyes: Moist conjunctiva Lungs: Normal respiratory effort CV: RRR GI: Abd soft, focally ttp in RLQ; no rebound/guarding; no tenderness elsewhere; nondistended MSK: Normal range of motion of extremities Psychiatric: Appropriate affect  Results for orders placed or performed during the hospital encounter of 10/11/22 (from the past 48 hour(s))  CBC with Differential     Status: None   Collection Time: 10/11/22 10:20 PM  Result Value Ref Range   WBC 9.3 4.0 - 10.5 K/uL   RBC 4.84 4.22 - 5.81 MIL/uL   Hemoglobin 15.1 13.0 - 17.0 g/dL   HCT 43.2 39.0 - 52.0 %   MCV 89.3 80.0 - 100.0 fL   MCH 31.2 26.0 - 34.0 pg   MCHC 35.0 30.0 - 36.0 g/dL   RDW 13.0 11.5 - 15.5 %   Platelets 186 150 - 400 K/uL   nRBC 0.0 0.0 - 0.2 %   Neutrophils Relative % 70 %   Neutro Abs 6.7 1.7 - 7.7 K/uL   Lymphocytes Relative 17 %   Lymphs Abs 1.6 0.7 - 4.0 K/uL   Monocytes Relative 9 %   Monocytes Absolute 0.8 0.1 - 1.0 K/uL   Eosinophils Relative 2 %   Eosinophils Absolute 0.2 0.0 - 0.5 K/uL   Basophils Relative 1 %   Basophils Absolute 0.1 0.0 - 0.1 K/uL   Immature Granulocytes 1 %   Abs Immature Granulocytes 0.05 0.00 - 0.07 K/uL    Comment: Performed at [facility], [address], [place], [place] [address]  Basic metabolic panel     Status: Abnormal   Collection Time: 10/11/22 10:20 PM  Result Value Ref Range   Sodium 140 135 - 145 mmol/L   Potassium 3.7 3.5 - 5.1 mmol/L   Chloride 101 98 - 111 mmol/L   CO2 26 22 - 32 mmol/L   Glucose, Bld 105 (H) 70 - 99 mg/dL    Comment: Glucose reference range applies  only to samples taken after fasting for at least 8 hours.   BUN 19 8 - 23 mg/dL   Creatinine, Ser 1.23 0.61 - 1.24 mg/dL   Calcium 9.4 8.9 -  10.3 mg/dL   GFR, Estimated >60 >60 mL/min    Comment: (NOTE) Calculated using the CKD-EPI Creatinine Equation (2021)    Anion gap 13 5 - 15    Comment: Performed at [facility], [place] [address], [place], [place] [address]    No results found.   A/P: Joseph Chase is an 66 y.o. male with acute appendicitis  -The anatomy and physiology of the GI tract was discussed with him. The pathophysiology of appendicitis was discussed as well.  -We reviewed options moving forward for treatment, covering IV abx vs surgery. We discussed that with antibiotics alone, there is reasonable success in managing appendicitis, however, risks of recurrence at 39yr being as high as 40% in some studies. We discussed appendectomy - laparoscopic and potential open techniques as well as scenarios where an ileocecectomy could be necessary. We discussed the material risks (including, but not limited to, pain, bleeding, infection, scarring, need for blood transfusion, damage to surrounding structures- blood vessels/nerves/viscus/organs, damage to ureter/bladder, urine leak, leak from staple line, need for additional procedures, hernia, recurrence although quite low, pneumonia, heart attack, stroke, death) benefits and alternatives to surgery were discussed. The patient's questions were answered to his satisfaction, he voiced understanding and elected to proceed with surgery. Additionally, we discussed typical postoperative expectations and the recovery process.  -We discussed the surgery would be carried out by my partner, Dr. RRosendo Gros who he would also meet with and discussed with prior to surgery and tentatively plan for this in the morning.  I spent a total of 55 minutes in both face-to-face and non-face-to-face activities, excluding procedures performed, for this visit  on the date of this encounter.   CNadeen Landau MM[facility], AT[facility]

## 2022-10-11 NOTE — ED Triage Notes (Signed)
The pt has had abd pain  since last Friday intermittently  he was seen in [facility] and had a  c-t  and was told that he had appendicitis.  He did not want to have surgery in [place] so he came to this ed

## 2022-10-12 ENCOUNTER — Other Ambulatory Visit: Payer: Self-pay

## 2022-10-12 ENCOUNTER — Observation Stay (HOSPITAL_COMMUNITY): Payer: Medicare Other | Admitting: Anesthesiology

## 2022-10-12 ENCOUNTER — Encounter (HOSPITAL_COMMUNITY): Payer: Self-pay

## 2022-10-12 ENCOUNTER — Observation Stay (HOSPITAL_BASED_OUTPATIENT_CLINIC_OR_DEPARTMENT_OTHER): Payer: Medicare Other | Admitting: Anesthesiology

## 2022-10-12 ENCOUNTER — Encounter (HOSPITAL_COMMUNITY)
Admission: EM | Disposition: A | Discharge status: Home / Self Care | Payer: Self-pay | Source: Home / Self Care | Attending: Emergency Medicine

## 2022-10-12 DIAGNOSIS — K37 Unspecified appendicitis: Secondary | ICD-10-CM | POA: Diagnosis not present

## 2022-10-12 DIAGNOSIS — K3532 Acute appendicitis with perforation and localized peritonitis, without abscess: Secondary | ICD-10-CM | POA: Diagnosis not present

## 2022-10-12 DIAGNOSIS — K3580 Unspecified acute appendicitis: Secondary | ICD-10-CM | POA: Diagnosis present

## 2022-10-12 HISTORY — PX: LAPAROSCOPIC APPENDECTOMY: SHX408

## 2022-10-12 LAB — GLUCOSE, CAPILLARY
Glucose-Capillary: 151 mg/dL — ABNORMAL HIGH (ref 70–99)
Glucose-Capillary: 179 mg/dL — ABNORMAL HIGH (ref 70–99)

## 2022-10-12 LAB — CBG MONITORING, ED
Glucose-Capillary: 215 mg/dL — ABNORMAL HIGH (ref 70–99)
Glucose-Capillary: 108 mg/dL — ABNORMAL HIGH (ref 70–99)
Glucose-Capillary: 107 mg/dL — ABNORMAL HIGH (ref 70–99)

## 2022-10-12 LAB — HIV ANTIBODY (ROUTINE TESTING W REFLEX): HIV Screen 4th Generation wRfx: NONREACTIVE

## 2022-10-12 SURGERY — APPENDECTOMY, LAPAROSCOPIC
Anesthesia: General

## 2022-10-12 MED ORDER — MESALAMINE 1000 MG RE SUPP
1000.0000 mg | Freq: Every day | RECTAL | Status: DC
  Filled 2022-10-12 (×2): qty 1

## 2022-10-12 MED ORDER — DEXAMETHASONE SODIUM PHOSPHATE 10 MG/ML IJ SOLN
INTRAMUSCULAR | Status: DC | PRN
  Administered 2022-10-12: 4 mg via INTRAVENOUS

## 2022-10-12 MED ORDER — 0.9 % SODIUM CHLORIDE (POUR BTL) OPTIME
TOPICAL | Status: DC | PRN
  Administered 2022-10-12: 1000 mL

## 2022-10-12 MED ORDER — PREDNISONE 5 MG PO TABS: 10.0000 mg | ORAL_TABLET | Freq: Every day | ORAL | Status: DC

## 2022-10-12 MED ORDER — ROCURONIUM BROMIDE 10 MG/ML (PF) SYRINGE
PREFILLED_SYRINGE | INTRAVENOUS | Status: DC | PRN
  Administered 2022-10-12: 50 mg via INTRAVENOUS

## 2022-10-12 MED ORDER — INSULIN ASPART 100 UNIT/ML IJ SOLN: 0.0000 [IU] | INTRAMUSCULAR | Status: DC | PRN

## 2022-10-12 MED ORDER — LOSARTAN POTASSIUM 50 MG PO TABS: 100.0000 mg | ORAL_TABLET | Freq: Every day | ORAL | Status: DC

## 2022-10-12 MED ORDER — GABAPENTIN 300 MG PO CAPS: 300.0000 mg | ORAL_CAPSULE | Freq: Every day | ORAL | Status: DC

## 2022-10-12 MED ORDER — DIPHENHYDRAMINE HCL 50 MG/ML IJ SOLN: 12.5000 mg | Freq: Four times a day (QID) | INTRAMUSCULAR | Status: DC | PRN

## 2022-10-12 MED ORDER — ZOLPIDEM TARTRATE 5 MG PO TABS
10.0000 mg | ORAL_TABLET | Freq: Every day | ORAL | Status: DC
  Administered 2022-10-12: 10 mg via ORAL
  Filled 2022-10-12: qty 2

## 2022-10-12 MED ORDER — ACETAMINOPHEN 500 MG PO TABS
1000.0000 mg | ORAL_TABLET | Freq: Four times a day (QID) | ORAL | Status: DC
  Administered 2022-10-12: 1000 mg via ORAL
  Filled 2022-10-12: qty 2

## 2022-10-12 MED ORDER — FENTANYL CITRATE (PF) 250 MCG/5ML IJ SOLN
INTRAMUSCULAR | Status: AC
  Filled 2022-10-12: qty 5

## 2022-10-12 MED ORDER — MIDAZOLAM HCL 2 MG/2ML IJ SOLN
INTRAMUSCULAR | Status: AC
  Filled 2022-10-12: qty 2

## 2022-10-12 MED ORDER — CHLORHEXIDINE GLUCONATE 0.12 % MT SOLN
OROMUCOSAL | Status: AC
  Administered 2022-10-12: 15 mL
  Filled 2022-10-12: qty 15

## 2022-10-12 MED ORDER — LACTATED RINGERS IV SOLN: INTRAVENOUS | Status: DC

## 2022-10-12 MED ORDER — HYDRALAZINE HCL 20 MG/ML IJ SOLN: 10.0000 mg | INTRAMUSCULAR | Status: DC | PRN

## 2022-10-12 MED ORDER — OXYCODONE HCL 5 MG PO TABS
5.0000 mg | ORAL_TABLET | Freq: Once | ORAL | Status: AC | PRN
  Administered 2022-10-12: 5 mg via ORAL

## 2022-10-12 MED ORDER — BUPIVACAINE HCL 0.25 % IJ SOLN
INTRAMUSCULAR | Status: DC | PRN
  Administered 2022-10-12: 10 mL

## 2022-10-12 MED ORDER — ROSUVASTATIN CALCIUM 5 MG PO TABS: 10.0000 mg | ORAL_TABLET | Freq: Every day | ORAL | Status: DC

## 2022-10-12 MED ORDER — ONDANSETRON HCL 4 MG/2ML IJ SOLN: 4.0000 mg | Freq: Four times a day (QID) | INTRAMUSCULAR | Status: DC | PRN

## 2022-10-12 MED ORDER — IBUPROFEN 400 MG PO TABS: 600.0000 mg | ORAL_TABLET | Freq: Four times a day (QID) | ORAL | Status: DC | PRN

## 2022-10-12 MED ORDER — LORATADINE 10 MG PO TABS: 10.0000 mg | ORAL_TABLET | Freq: Every day | ORAL | Status: DC

## 2022-10-12 MED ORDER — OXYCODONE HCL 5 MG/5ML PO SOLN: 5.0000 mg | Freq: Once | ORAL | Status: AC | PRN

## 2022-10-12 MED ORDER — PHENYLEPHRINE 80 MCG/ML (10ML) SYRINGE FOR IV PUSH (FOR BLOOD PRESSURE SUPPORT)
PREFILLED_SYRINGE | INTRAVENOUS | Status: DC | PRN
  Administered 2022-10-12: 160 ug via INTRAVENOUS

## 2022-10-12 MED ORDER — ONDANSETRON 4 MG PO TBDP: 4.0000 mg | ORAL_TABLET | Freq: Four times a day (QID) | ORAL | Status: DC | PRN

## 2022-10-12 MED ORDER — SIMETHICONE 80 MG PO CHEW: 40.0000 mg | CHEWABLE_TABLET | Freq: Four times a day (QID) | ORAL | Status: DC | PRN

## 2022-10-12 MED ORDER — POLYETHYLENE GLYCOL 3350 17 G PO PACK: 17.0000 g | PACK | Freq: Every day | ORAL | 0 refills | Status: DC | PRN

## 2022-10-12 MED ORDER — OXYCODONE HCL 5 MG PO TABS
ORAL_TABLET | ORAL | Status: AC
  Filled 2022-10-12: qty 1

## 2022-10-12 MED ORDER — FENTANYL CITRATE (PF) 250 MCG/5ML IJ SOLN
INTRAMUSCULAR | Status: DC | PRN
  Administered 2022-10-12 (×3): 50 ug via INTRAVENOUS

## 2022-10-12 MED ORDER — METOPROLOL SUCCINATE ER 25 MG PO TB24
75.0000 mg | ORAL_TABLET | Freq: Every day | ORAL | Status: DC
  Administered 2022-10-12: 50 mg via ORAL
  Filled 2022-10-12: qty 3

## 2022-10-12 MED ORDER — ONDANSETRON HCL 4 MG/2ML IJ SOLN
INTRAMUSCULAR | Status: DC | PRN
  Administered 2022-10-12: 4 mg via INTRAVENOUS

## 2022-10-12 MED ORDER — TRAMADOL HCL 50 MG PO TABS: 50.0000 mg | ORAL_TABLET | Freq: Four times a day (QID) | ORAL | 0 refills | Status: DC | PRN

## 2022-10-12 MED ORDER — MIDAZOLAM HCL 5 MG/5ML IJ SOLN
INTRAMUSCULAR | Status: DC | PRN
  Administered 2022-10-12: 2 mg via INTRAVENOUS

## 2022-10-12 MED ORDER — SODIUM CHLORIDE 0.9 % IR SOLN
Status: DC | PRN
  Administered 2022-10-12: 500 mL

## 2022-10-12 MED ORDER — PROPOFOL 10 MG/ML IV BOLUS
INTRAVENOUS | Status: DC | PRN
  Administered 2022-10-12: 150 mg via INTRAVENOUS

## 2022-10-12 MED ORDER — SODIUM CHLORIDE 0.9 % IV SOLN
2.0000 g | INTRAVENOUS | Status: DC
  Administered 2022-10-12: 2 g via INTRAVENOUS
  Filled 2022-10-12: qty 20

## 2022-10-12 MED ORDER — ACETAMINOPHEN 500 MG PO TABS: 1000.0000 mg | ORAL_TABLET | Freq: Four times a day (QID) | ORAL | 0 refills | Status: AC | PRN

## 2022-10-12 MED ORDER — DOCUSATE SODIUM 100 MG PO CAPS
100.0000 mg | ORAL_CAPSULE | Freq: Two times a day (BID) | ORAL | Status: DC
  Filled 2022-10-12: qty 1

## 2022-10-12 MED ORDER — SUGAMMADEX SODIUM 200 MG/2ML IV SOLN
INTRAVENOUS | Status: DC | PRN
  Administered 2022-10-12: 320 mg via INTRAVENOUS

## 2022-10-12 MED ORDER — HYDROMORPHONE HCL 1 MG/ML IJ SOLN: 0.5000 mg | INTRAMUSCULAR | Status: DC | PRN

## 2022-10-12 MED ORDER — HYDROCHLOROTHIAZIDE 25 MG PO TABS: 25.0000 mg | ORAL_TABLET | Freq: Every day | ORAL | Status: DC

## 2022-10-12 MED ORDER — METRONIDAZOLE 500 MG/100ML IV SOLN
500.0000 mg | Freq: Two times a day (BID) | INTRAVENOUS | Status: DC
  Administered 2022-10-12: 500 mg via INTRAVENOUS
  Filled 2022-10-12: qty 100

## 2022-10-12 MED ORDER — TRAMADOL HCL 50 MG PO TABS: 50.0000 mg | ORAL_TABLET | Freq: Four times a day (QID) | ORAL | Status: DC | PRN

## 2022-10-12 MED ORDER — ENOXAPARIN SODIUM 40 MG/0.4ML IJ SOSY: 40.0000 mg | PREFILLED_SYRINGE | INTRAMUSCULAR | Status: DC

## 2022-10-12 MED ORDER — INSULIN ASPART 100 UNIT/ML IJ SOLN
0.0000 [IU] | Freq: Every day | INTRAMUSCULAR | Status: DC
  Administered 2022-10-12: 2 [IU] via SUBCUTANEOUS

## 2022-10-12 MED ORDER — FENTANYL CITRATE (PF) 100 MCG/2ML IJ SOLN: 25.0000 ug | INTRAMUSCULAR | Status: DC | PRN

## 2022-10-12 MED ORDER — DIPHENHYDRAMINE HCL 12.5 MG/5ML PO ELIX: 12.5000 mg | ORAL_SOLUTION | Freq: Four times a day (QID) | ORAL | Status: DC | PRN

## 2022-10-12 MED ORDER — LIDOCAINE 2% (20 MG/ML) 5 ML SYRINGE
INTRAMUSCULAR | Status: DC | PRN
  Administered 2022-10-12: 60 mg via INTRAVENOUS

## 2022-10-12 MED ORDER — INSULIN ASPART 100 UNIT/ML IJ SOLN: 0.0000 [IU] | Freq: Three times a day (TID) | INTRAMUSCULAR | Status: DC

## 2022-10-12 MED ORDER — PHENYLEPHRINE 80 MCG/ML (10ML) SYRINGE FOR IV PUSH (FOR BLOOD PRESSURE SUPPORT)
PREFILLED_SYRINGE | INTRAVENOUS | Status: AC
  Filled 2022-10-12: qty 10

## 2022-10-12 MED ORDER — BUPIVACAINE-EPINEPHRINE (PF) 0.25% -1:200000 IJ SOLN
INTRAMUSCULAR | Status: AC
  Filled 2022-10-12: qty 30

## 2022-10-12 MED ORDER — AZATHIOPRINE 50 MG PO TABS
200.0000 mg | ORAL_TABLET | Freq: Every day | ORAL | Status: DC
  Filled 2022-10-12 (×2): qty 4

## 2022-10-12 SURGICAL SUPPLY — 46 items
ADH SKN CLS APL DERMABOND .7 (GAUZE/BANDAGES/DRESSINGS) ×1
APL PRP STRL LF DISP 70% ISPRP (MISCELLANEOUS) ×1
APPLIER CLIP 5 13 M/L LIGAMAX5 (MISCELLANEOUS)
APR CLP MED LRG 5 ANG JAW (MISCELLANEOUS)
BLADE CLIPPER SURG (BLADE) IMPLANT
CANISTER SUCT 3000ML PPV (MISCELLANEOUS) ×2 IMPLANT
CHLORAPREP W/TINT 26 (MISCELLANEOUS) ×2 IMPLANT
CLIP APPLIE 5 13 M/L LIGAMAX5 (MISCELLANEOUS) IMPLANT
CLIP LIGATING HEMO LOK XL GOLD (MISCELLANEOUS) ×2 IMPLANT
COVER SURGICAL LIGHT HANDLE (MISCELLANEOUS) ×2 IMPLANT
COVER TRANSDUCER ULTRASND (DRAPES) ×2 IMPLANT
DERMABOND ADVANCED .7 DNX12 (GAUZE/BANDAGES/DRESSINGS) ×2 IMPLANT
ELECT REM PT RETURN 9FT ADLT (ELECTROSURGICAL) ×1
ELECTRODE REM PT RTRN 9FT ADLT (ELECTROSURGICAL) ×2 IMPLANT
ENDOLOOP SUT PDS II  0 18 (SUTURE)
ENDOLOOP SUT PDS II 0 18 (SUTURE) IMPLANT
GLOVE BIO SURGEON STRL SZ7.5 (GLOVE) ×2 IMPLANT
GLOVE SURG SYN 7.5  E (GLOVE) ×1
GLOVE SURG SYN 7.5 E (GLOVE) ×1 IMPLANT
GLOVE SURG SYN 7.5 PF PI (GLOVE) ×2 IMPLANT
GOWN STRL REUS W/ TWL LRG LVL3 (GOWN DISPOSABLE) ×4 IMPLANT
GOWN STRL REUS W/ TWL XL LVL3 (GOWN DISPOSABLE) ×2 IMPLANT
GOWN STRL REUS W/TWL LRG LVL3 (GOWN DISPOSABLE) ×2
GOWN STRL REUS W/TWL XL LVL3 (GOWN DISPOSABLE) ×1
GRASPER SUT TROCAR 14GX15 (MISCELLANEOUS) ×2 IMPLANT
KIT BASIN OR (CUSTOM PROCEDURE TRAY) ×2 IMPLANT
KIT TURNOVER KIT B (KITS) ×2 IMPLANT
NDL INSUFFLATION 14GA 120MM (NEEDLE) ×2 IMPLANT
NEEDLE INSUFFLATION 14GA 120MM (NEEDLE) ×1 IMPLANT
NS IRRIG 1000ML POUR BTL (IV SOLUTION) ×2 IMPLANT
PAD ARMBOARD 7.5X6 YLW CONV (MISCELLANEOUS) ×4 IMPLANT
SCISSORS LAP 5X35 DISP (ENDOMECHANICALS) ×2 IMPLANT
SET IRRIG TUBING LAPAROSCOPIC (IRRIGATION / IRRIGATOR) ×2 IMPLANT
SET TUBE SMOKE EVAC HIGH FLOW (TUBING) ×2 IMPLANT
SLEEVE ENDOPATH XCEL 5M (ENDOMECHANICALS) ×2 IMPLANT
SPECIMEN JAR SMALL (MISCELLANEOUS) ×2 IMPLANT
SUT MNCRL AB 4-0 PS2 18 (SUTURE) ×2 IMPLANT
TOWEL GREEN STERILE (TOWEL DISPOSABLE) ×2 IMPLANT
TOWEL GREEN STERILE FF (TOWEL DISPOSABLE) ×2 IMPLANT
TRAY FOLEY W/BAG SLVR 16FR (SET/KITS/TRAYS/PACK)
TRAY FOLEY W/BAG SLVR 16FR ST (SET/KITS/TRAYS/PACK) IMPLANT
TRAY LAPAROSCOPIC MC (CUSTOM PROCEDURE TRAY) ×2 IMPLANT
TROCAR XCEL NON-BLD 11X100MML (ENDOMECHANICALS) ×2 IMPLANT
TROCAR Z-THREAD OPTICAL 5X100M (TROCAR) ×2 IMPLANT
WARMER LAPAROSCOPE (MISCELLANEOUS) ×2 IMPLANT
WATER STERILE IRR 1000ML POUR (IV SOLUTION) ×2 IMPLANT

## 2022-10-12 NOTE — Anesthesia Preprocedure Evaluation (Signed)
Anesthesia Evaluation  Patient identified by MRN, date of birth, ID band Patient awake    Reviewed: Allergy & Precautions, H&P , NPO status , Patient's Chart, lab work & pertinent test results  Airway Mallampati: II   Neck ROM: full    Dental   Pulmonary neg pulmonary ROS   breath sounds clear to auscultation       Cardiovascular hypertension,  Rhythm:regular Rate:Normal     Neuro/Psych    GI/Hepatic PUD,,,  Endo/Other  diabetes, Type 2    Renal/GU      Musculoskeletal   Abdominal   Peds  Hematology   Anesthesia Other Findings   Reproductive/Obstetrics                             Anesthesia Physical Anesthesia Plan  ASA: 2  Anesthesia Plan: General   Post-op Pain Management:    Induction: Intravenous  PONV Risk Score and Plan: 2 and Ondansetron, Dexamethasone, Midazolam and Treatment may vary due to age or medical condition  Airway Management Planned: Oral ETT  Additional Equipment:   Intra-op Plan:   Post-operative Plan: Extubation in OR  Informed Consent: I have reviewed the patients History and Physical, chart, labs and discussed the procedure including the risks, benefits and alternatives for the proposed anesthesia with the patient or authorized representative who has indicated his/her understanding and acceptance.     Dental advisory given  Plan Discussed with: CRNA, Anesthesiologist and Surgeon  Anesthesia Plan Comments:        Anesthesia Quick Evaluation

## 2022-10-12 NOTE — Op Note (Signed)
10/12/2022  11:37 AM  PATIENT:  Joseph Chase  66 y.o. male  PRE-OPERATIVE DIAGNOSIS:  Appendicitis  POST-OPERATIVE DIAGNOSIS:  acute Appendicitis  PROCEDURE:  Procedure(s): APPENDECTOMY LAPAROSCOPIC (N/A)  SURGEON:  Surgeon(s) and Role:    Ralene Ok, MD - Primary  ASSISTANTS: Pryor Curia, RNFA   ANESTHESIA:   local and general  EBL:  minimal   BLOOD ADMINISTERED:none  DRAINS: none   LOCAL MEDICATIONS USED:  BUPIVICAINE   SPECIMEN:  Source of Specimen:  appendix  DISPOSITION OF SPECIMEN:  N/A  COUNTS:  YES  TOURNIQUET:  * No tourniquets in log *  DICTATION: .Dragon Dictation Complications: none  Counts: reported as correct x 2  Findings:  The patient had a acutely inflamed, non-perforated appendix  Specimen: Appendix  Indications for procedure:  The patient is a 66 year old male with a history of periumbilical pain localized in the right lower quadrant patient had a CT scan which revealed signs consistent with acute appendicitis the patient back in for laparoscopic appendectomy.  Details of the procedure:The patient was taken back to the operating room. The patient was placed in supine position with bilateral SCDs in place. The patient was prepped and draped in the usual sterile fashion.  After appropriate anitbiotics were confirmed, a time-out was confirmed and all facts were verified.    A pneumoperitoneum of 14 mmHg was obtained via a Veress needle technique in the left lower quadrant quadrant.  A 5 mm trocar and 5 mm camera then placed intra-abdominally there is no injury to any intra-abdominal organs a 10 mm infraumbilical port was placed and direct visualization as was a 5 mm port in the suprapubic area.   The appendix was identified and seen to be non-perforated.  The appendix was cleaned down to the appendiceal base. The mesoappendix was then incised and the appendiceal artery was cauterized.  The the appendiceal base was clean.  A gold  hemoclip was placed proximallyx2 and one distally and the appendix was transected between these 2. A retrieval bag was then placed into the abdomen and the specimen placed in the bag. The appendiceal stump was cauterized. We evacuate the fluid from the pelvis until the effluent was clear.  The appendix and retrieval  bag was then retrieved via the supraumbilical port. #1 Vicryl was used to reapproximate the fascia at the umbilical port site x1. The skin was reapproximated all port sites 3-0 Monocryl subcuticular fashion. The skin was dressed with Dermabond.  The patient was awakened from general anesthesia was taken to recovery room in stable condition.      PLAN OF CARE: Discharge to home after PACU  PATIENT DISPOSITION:  PACU - hemodynamically stable.   Delay start of Pharmacological VTE agent (>24hrs) due to surgical blood loss or risk of bleeding: not applicable

## 2022-10-12 NOTE — Transfer of Care (Signed)
Immediate Anesthesia Transfer of Care Note  Patient: Joseph Chase [facility]  Procedure(s) Performed: APPENDECTOMY LAPAROSCOPIC  Patient Location: PACU  Anesthesia Type:General  Level of Consciousness: awake, alert , and oriented  Airway & Oxygen Therapy: Patient Spontanous Breathing and Patient connected to nasal cannula oxygen  Post-op Assessment: Report given to RN and Post -op Vital signs reviewed and stable  Post vital signs: Reviewed and stable  Last Vitals:  Vitals Value Taken Time  BP 156/81 10/12/22 1149  Temp    Pulse 72 10/12/22 1150  Resp 15 10/12/22 1150  SpO2 98 % 10/12/22 1150  Vitals shown include unvalidated device data.  Last Pain:  Vitals:   10/12/22 0900  TempSrc: Oral  PainSc: 0-No pain         Complications: No notable events documented.

## 2022-10-12 NOTE — Progress Notes (Signed)
CBG at 10:46 o'clock was 151, after 107 at 07:56 o'clock. Per Dr. Marcie Bal, insulin is hold at this time. Will continue to monitor.

## 2022-10-12 NOTE — Discharge Instructions (Addendum)
[facility]  Please arrive at least 30 min before your appointment to complete your check in paperwork.  If you are unable to arrive 30 min prior to your appointment time we may have to cancel or reschedule you. LAPAROSCOPIC SURGERY: POST OP INSTRUCTIONS Always review your discharge instruction sheet given to you by the facility where your surgery was performed. IF YOU HAVE DISABILITY OR FAMILY LEAVE FORMS, YOU MUST BRING THEM TO THE OFFICE FOR PROCESSING.   DO NOT GIVE THEM TO YOUR DOCTOR.  PAIN CONTROL  First take acetaminophen (Tylenol) AND/or ibuprofen (Advil) to control your pain after surgery.  Follow directions on package.  Taking acetaminophen (Tylenol) and/or ibuprofen (Advil) regularly after surgery will help to control your pain and lower the amount of prescription pain medication you may need.  You should not take more than 4,000 mg (4 grams) of acetaminophen (Tylenol) in 24 hours.  You should not take ibuprofen (Advil), aleve, motrin, naprosyn or other NSAIDS if you have a history of stomach ulcers or chronic kidney disease.  A prescription for pain medication may be given to you upon discharge.  Take your pain medication as prescribed, if you still have uncontrolled pain after taking acetaminophen (Tylenol) or ibuprofen (Advil). Use ice packs to help control pain. If you need a refill on your pain medication, please contact your pharmacy.  They will contact our office to request authorization. Prescriptions will not be filled after 5pm or on week-ends.  HOME MEDICATIONS Take your usually prescribed medications unless otherwise directed.  DIET You should follow a light diet the first few days after arrival home.  Be sure to include lots of fluids daily. Avoid fatty, fried foods.   CONSTIPATION It is common to experience some constipation after surgery and if you are taking pain medication.  Increasing fluid intake and taking a stool softener (such as Colace)  will usually help or prevent this problem from occurring.  A mild laxative (Milk of Magnesia or Miralax) should be taken according to package instructions if there are no bowel movements after 48 hours.  WOUND/INCISION CARE Most patients will experience some swelling and bruising in the area of the incisions.  Ice packs will help.  Swelling and bruising can take several days to resolve.  Unless discharge instructions indicate otherwise, follow guidelines below  STERI-STRIPS - you may remove your outer bandages 48 hours after surgery, and you may shower at that time.  You have steri-strips (small skin tapes) in place directly over the incision.  These strips should be left on the skin for 7-10 days.   DERMABOND/SKIN GLUE - you may shower in 24 hours.  The glue will flake off over the next 2-3 weeks. Any sutures or staples will be removed at the office during your follow-up visit.  ACTIVITIES You may resume regular (light) daily activities beginning the next day--such as daily self-care, walking, climbing stairs--gradually increasing activities as tolerated.  You may have sexual intercourse when it is comfortable.  Refrain from any heavy lifting or straining until approved by your doctor. You may drive when you are no longer taking prescription pain medication, you can comfortably wear a seatbelt, and you can safely maneuver your car and apply brakes.  FOLLOW-UP You should see your doctor in the office for a follow-up appointment approximately 2-3 weeks after your surgery.  You should have been given your post-op/follow-up appointment when your surgery was scheduled.  If you did not receive a post-op/follow-up appointment, make sure   that you call for this appointment within a day or two after you arrive home to insure a convenient appointment time.  OTHER INSTRUCTIONS  WHEN TO CALL YOUR DOCTOR: Fever over 101.0 Inability to urinate Continued bleeding from incision. Increased pain, redness, or  drainage from the incision. Increasing abdominal pain  The clinic staff is available to answer your questions during regular business hours.  Please don't hesitate to call and ask to speak to one of the nurses for clinical concerns.  If you have a medical emergency, go to the nearest emergency room or call 911.  A surgeon from [facility] is always on call at the hospital. [address], [address], [place], [place]  [address] ? [address] [address], , [place]   [address] [phone] ? [phone] ? FAX [phone]   

## 2022-10-12 NOTE — Anesthesia Procedure Notes (Signed)
Procedure Name: Intubation Date/Time: 10/12/2022 11:05 AM  Performed by: Barrington Ellison, CRNAPre-anesthesia Checklist: Patient identified, Emergency Drugs available, Suction available and Patient being monitored Patient Re-evaluated:Patient Re-evaluated prior to induction Oxygen Delivery Method: Circle System Utilized Preoxygenation: Pre-oxygenation with 100% oxygen Induction Type: IV induction Ventilation: Mask ventilation without difficulty Laryngoscope Size: Mac and 4 Grade View: Grade I Tube type: Oral Tube size: 7.5 mm Number of attempts: 1 Airway Equipment and Method: Stylet and Oral airway Placement Confirmation: ETT inserted through vocal cords under direct vision, positive ETCO2 and breath sounds checked- equal and bilateral Secured at: 22 cm Tube secured with: Tape Dental Injury: Teeth and Oropharynx as per pre-operative assessment

## 2022-10-12 NOTE — ED Notes (Signed)
Pt transported to short stay at this time in gown with labeled belongings.

## 2022-10-12 NOTE — Interval H&P Note (Signed)
History and Physical Interval Note:  10/12/2022 10:23 AM  Joseph Chase  has presented today for surgery, with the diagnosis of Appendicitis.  The various methods of treatment have been discussed with the patient and family. After consideration of risks, benefits and other options for treatment, the patient has consented to  Procedure(s): APPENDECTOMY LAPAROSCOPIC (N/A) as a surgical intervention.  The patient's history has been reviewed, patient examined, no change in status, stable for surgery.  I have reviewed the patient's chart and labs.  Questions were answered to the patient's satisfaction.     Ralene Ok

## 2022-10-12 NOTE — ED Notes (Signed)
Called report to short stay at this time. Pt going to [facility]

## 2022-10-12 NOTE — Discharge Summary (Signed)
[facility] Discharge Summary   Patient ID: Azael Ragain MRN: [id number] DOB/AGE: February 18, 1956 66 y.o.  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  Procedures Dr. Rosendo Gros (10/12/2022) - Laparoscopic Appendectomy  Hospital Course:  Enzio Buchler is a 66 y.o. male with reported history of ulcerative colitis followed through [facility] and maintained on azathioprine who presented to the emergency department with right lower quad abdominal pain that began Saturday night. Work up showed acute appendicitis. Patient was admitted and underwent procedure listed above.  Tolerated procedure well. On POD0 the patient was felt stable for discharge home.  Patient will follow up as below and knows to call with questions or concerns.      Allergies as of 10/12/2022       Reactions   Venomil Mixed Vespid [mixed Vespid Venom]    Venomil Wasp Venom [wasp Venom]    Penicillins Rash        Medication List     STOP taking these medications    aspirin 81 MG chewable tablet       TAKE these medications    acetaminophen 500 MG tablet Commonly known as: TYLENOL Take 2 tablets (1,000 mg total) by mouth every 6 (six) hours as needed for mild pain.   azaTHIOprine 50 MG tablet Commonly known as: IMURAN Take 200 mg by mouth daily.   co-enzyme Q-10 30 MG capsule Take 30 mg by mouth daily.   EPINEPHrine 0.3 mg/0.3 mL Soaj injection Commonly known as: EpiPen 2-Pak Inject 0.3 mg into the muscle daily as needed. For severe life-threatening allergic reaction   fexofenadine 180 MG tablet Commonly known as: ALLEGRA Take 180 mg by mouth daily.   gabapentin 300 MG capsule Commonly known as: NEURONTIN Take 1 capsule by mouth daily.    glipiZIDE 5 MG tablet Commonly known as: GLUCOTROL Take 5 mg by mouth 2 (two) times daily.   hydrochlorothiazide 25 MG tablet Commonly known as: HYDRODIURIL Take 25 mg by mouth daily.   losartan 100 MG tablet Commonly known as: COZAAR Take 100 mg by mouth daily.   mesalamine 1000 MG suppository Commonly known as: CANASA Place 1,000 mg rectally at bedtime.   metFORMIN 500 MG 24 hr tablet Commonly known as: GLUCOPHAGE-XR Take 500-1,000 mg by mouth 2 (two) times daily. 500 mg in the morning and 1000 mg in the evening   metoprolol succinate 25 MG 24 hr tablet Commonly known as: TOPROL-XL Take 75 mg by mouth daily.   polyethylene glycol 17 g packet Commonly known as: MiraLax Take 17 g by mouth daily as needed for mild constipation.   predniSONE 10 MG tablet Commonly known as: DELTASONE Take 10 mg by mouth daily.   PRESERVISION AREDS 2 PO Take by mouth in the morning and at bedtime.   rosuvastatin 10 MG tablet Commonly known as: CRESTOR Take by mouth.   sildenafil 20 MG tablet Commonly known as: REVATIO Take 3-5 tablets 30 minutes before intercourse.   Thera Tabs Take 1 tablet by mouth daily.   traMADol 50 MG tablet Commonly known as: ULTRAM Take 1 tablet (50 mg total) by mouth every 6 (six) hours as needed for severe pain.   zolpidem 10 MG tablet Commonly known as: AMBIEN Take 10 mg by mouth at bedtime.  Follow-up [facility], [place]. Go on 10/31/2022.   Specialty: General Surgery Why: Your appointment is 10/31/22 at 11:15 am Arrive 51mn early to check in, fill out paperwork, Bring photo ID and insurance information Contact information: [phone]                Signed: BWellington Hampshire [facility] 10/12/2022, 4:23 PM Please see Amion for pager number during day hours 7:00am-4:30pm

## 2022-10-13 ENCOUNTER — Encounter (HOSPITAL_COMMUNITY): Payer: Self-pay | Admitting: General Surgery

## 2022-10-13 LAB — SURGICAL PATHOLOGY

## 2022-10-16 NOTE — Anesthesia Postprocedure Evaluation (Signed)
Anesthesia Post Note  Patient: Joseph Chase [facility]  Procedure(s) Performed: APPENDECTOMY LAPAROSCOPIC     Patient location during evaluation: PACU Anesthesia Type: General Level of consciousness: awake and alert Pain management: pain level controlled Vital Signs Assessment: post-procedure vital signs reviewed and stable Respiratory status: spontaneous breathing, nonlabored ventilation, respiratory function stable and patient connected to nasal cannula oxygen Cardiovascular status: blood pressure returned to baseline and stable Postop Assessment: no apparent nausea or vomiting Anesthetic complications: no   No notable events documented.  Last Vitals:  Vitals:   10/12/22 1200 10/12/22 1215  BP: 139/76 135/80  Pulse: 60 63  Resp: 15 17  Temp:  36.9 C  SpO2: 98% 95%    Last Pain:  Vitals:   10/12/22 1150  TempSrc:   PainSc: 0-No pain                 Marieliz Strang S

## 2022-10-23 DIAGNOSIS — R059 Cough, unspecified: Secondary | ICD-10-CM | POA: Diagnosis not present

## 2022-11-15 ENCOUNTER — Ambulatory Visit (INDEPENDENT_AMBULATORY_CARE_PROVIDER_SITE_OTHER): Payer: Medicare Other

## 2022-11-15 DIAGNOSIS — T63441D Toxic effect of venom of bees, accidental (unintentional), subsequent encounter: Secondary | ICD-10-CM | POA: Diagnosis not present

## 2022-11-17 ENCOUNTER — Ambulatory Visit: Payer: Medicare Other

## 2022-12-01 DIAGNOSIS — E039 Hypothyroidism, unspecified: Secondary | ICD-10-CM | POA: Diagnosis not present

## 2022-12-01 DIAGNOSIS — R5382 Chronic fatigue, unspecified: Secondary | ICD-10-CM | POA: Diagnosis not present

## 2022-12-01 DIAGNOSIS — E1169 Type 2 diabetes mellitus with other specified complication: Secondary | ICD-10-CM | POA: Diagnosis not present

## 2022-12-01 DIAGNOSIS — Z1329 Encounter for screening for other suspected endocrine disorder: Secondary | ICD-10-CM | POA: Diagnosis not present

## 2022-12-01 DIAGNOSIS — E1165 Type 2 diabetes mellitus with hyperglycemia: Secondary | ICD-10-CM | POA: Diagnosis not present

## 2022-12-01 DIAGNOSIS — K5090 Crohn's disease, unspecified, without complications: Secondary | ICD-10-CM | POA: Diagnosis not present

## 2022-12-01 DIAGNOSIS — E7800 Pure hypercholesterolemia, unspecified: Secondary | ICD-10-CM | POA: Diagnosis not present

## 2022-12-01 DIAGNOSIS — I10 Essential (primary) hypertension: Secondary | ICD-10-CM | POA: Diagnosis not present

## 2022-12-04 DIAGNOSIS — L821 Other seborrheic keratosis: Secondary | ICD-10-CM | POA: Diagnosis not present

## 2022-12-04 DIAGNOSIS — D225 Melanocytic nevi of trunk: Secondary | ICD-10-CM | POA: Diagnosis not present

## 2022-12-04 DIAGNOSIS — L570 Actinic keratosis: Secondary | ICD-10-CM | POA: Diagnosis not present

## 2022-12-04 DIAGNOSIS — Z1283 Encounter for screening for malignant neoplasm of skin: Secondary | ICD-10-CM | POA: Diagnosis not present

## 2022-12-04 DIAGNOSIS — X32XXXD Exposure to sunlight, subsequent encounter: Secondary | ICD-10-CM | POA: Diagnosis not present

## 2022-12-05 DIAGNOSIS — K5190 Ulcerative colitis, unspecified, without complications: Secondary | ICD-10-CM | POA: Diagnosis not present

## 2022-12-05 DIAGNOSIS — G4733 Obstructive sleep apnea (adult) (pediatric): Secondary | ICD-10-CM | POA: Diagnosis not present

## 2022-12-05 DIAGNOSIS — I10 Essential (primary) hypertension: Secondary | ICD-10-CM | POA: Diagnosis not present

## 2022-12-05 DIAGNOSIS — Z23 Encounter for immunization: Secondary | ICD-10-CM | POA: Diagnosis not present

## 2022-12-05 DIAGNOSIS — E1169 Type 2 diabetes mellitus with other specified complication: Secondary | ICD-10-CM | POA: Diagnosis not present

## 2022-12-05 DIAGNOSIS — E7849 Other hyperlipidemia: Secondary | ICD-10-CM | POA: Diagnosis not present

## 2022-12-08 DIAGNOSIS — S134XXA Sprain of ligaments of cervical spine, initial encounter: Secondary | ICD-10-CM | POA: Diagnosis not present

## 2022-12-08 DIAGNOSIS — M9903 Segmental and somatic dysfunction of lumbar region: Secondary | ICD-10-CM | POA: Diagnosis not present

## 2022-12-08 DIAGNOSIS — S338XXA Sprain of other parts of lumbar spine and pelvis, initial encounter: Secondary | ICD-10-CM | POA: Diagnosis not present

## 2022-12-08 DIAGNOSIS — M9902 Segmental and somatic dysfunction of thoracic region: Secondary | ICD-10-CM | POA: Diagnosis not present

## 2022-12-08 DIAGNOSIS — M9901 Segmental and somatic dysfunction of cervical region: Secondary | ICD-10-CM | POA: Diagnosis not present

## 2022-12-08 DIAGNOSIS — S233XXA Sprain of ligaments of thoracic spine, initial encounter: Secondary | ICD-10-CM | POA: Diagnosis not present

## 2022-12-14 ENCOUNTER — Ambulatory Visit (INDEPENDENT_AMBULATORY_CARE_PROVIDER_SITE_OTHER): Payer: Medicare Other | Admitting: Nurse Practitioner

## 2022-12-14 ENCOUNTER — Encounter: Payer: Self-pay | Admitting: Nurse Practitioner

## 2022-12-14 VITALS — BP 130/80 | HR 74 | Ht 69.5 in | Wt 184.4 lb

## 2022-12-14 DIAGNOSIS — G4733 Obstructive sleep apnea (adult) (pediatric): Secondary | ICD-10-CM

## 2022-12-14 DIAGNOSIS — F5101 Primary insomnia: Secondary | ICD-10-CM

## 2022-12-14 NOTE — Progress Notes (Signed)
$'@Patient'c$  ID: Joseph Chase, male    DOB: [birth year], 67 y.o.   MRN: [id number]  Chief Complaint  Patient presents with   Consult    Pt consult for sleep, states he hasn't used CPAP in 46yr, [name] was the DME.     Referring provider: [name]  HPI: 67year old male, never smoker referred for sleep consult. Past medical history significant for   TEST/EVENTS:   12/14/2022: Today - sleep consult  Patient presents today for sleep consult. Diagnosed with OSA years ago and was on CPAP; however, his wife became ill around 5 years ago and then after caring for her, he stopped wearing his machine about 2 years ago. She unfortunately passed around the same time. Since then, he doesn't necessarily feel like his daytime sleepiness and sleep are much different. He's struggled with his sleep for a long time. He has been on trazodone and ambien. He has trouble falling and staying asleep. Despite CPAP therapy, he still had restless sleep and wakes up feeling poorly rested. He did have a lot of life stressors at the time. He was told previously that he snored. Never told he stopped breathing. He's unable to nap or fall asleep during the day, regardless of how tired he is. He denies current trouble with morning dry mouth, headaches, sleep parasomnias/paralysis. No history of narcolepsy or cataplexy.  He goes to bed around 11-11:30 pm. With ambien, falls asleep within 15 min. This tends to only keep him asleep for around 4-5 hours. Without it, he will wake up 5-6 times a night. Gets out of bed around 7:30 am. [facility] [place] He does not remember the office or the provider. He thinks he had either mild or moderate OSA. He is down 10 lb over the past two years. He has a history of HTN and DM. No history of stroke. 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 Drinks 6-10 beers a week. Lives by himself. Family  history of heart disease.   Epworth 0   Allergies  Allergen Reactions   Venomil Mixed Vespid [Mixed Vespid Venom]    Venomil Wasp Venom [Wasp Venom]    Penicillins Rash    Immunization History  Administered Date(s) Administered   Moderna Sars-Covid-2 Vaccination 12/17/2019, 01/14/2020    Past Medical History:  Diagnosis Date   Borderline diabetes    Diabetes mellitus without complication (HCC)    Elevated cholesterol    Hypertension    Ulcerative colitis ([place]     Tobacco History: Social History   Tobacco Use  Smoking Status Never  Smokeless Tobacco Never   Counseling given: Not Answered   Outpatient Medications Prior to Visit  Medication Sig Dispense Refill   acetaminophen (TYLENOL) 500 MG tablet Take 2 tablets (1,000 mg total) by mouth every 6 (six) hours as needed for mild pain. 30 tablet 0   azaTHIOprine (IMURAN) 50 MG tablet Take 200 mg by mouth daily.     co-enzyme Q-10 30 MG capsule Take 30 mg by mouth daily.     EPINEPHrine (EPIPEN 2-PAK) 0.3 mg/0.3 mL IJ SOAJ injection Inject 0.3 mg into the muscle daily as needed. For severe life-threatening allergic reaction 1 each 3   fexofenadine (ALLEGRA) 180 MG tablet Take 180 mg by mouth daily.     gabapentin (NEURONTIN) 300 MG capsule Take 1 capsule by mouth daily.  glipiZIDE (GLUCOTROL) 5 MG tablet Take 5 mg by mouth 2 (two) times daily.     hydrochlorothiazide (HYDRODIURIL) 25 MG tablet Take 25 mg by mouth daily.     losartan (COZAAR) 100 MG tablet Take 100 mg by mouth daily.     metFORMIN (GLUCOPHAGE-XR) 500 MG 24 hr tablet Take 500-1,000 mg by mouth 2 (two) times daily. 500 mg in the morning and 1000 mg in the evening     metoprolol succinate (TOPROL-XL) 25 MG 24 hr tablet Take 75 mg by mouth daily.     Multiple Vitamin (THERA) TABS Take 1 tablet by mouth daily.     Multiple Vitamins-Minerals (PRESERVISION AREDS 2 PO) Take by mouth in the morning and at bedtime.     rosuvastatin (CRESTOR) 10 MG tablet Take by  mouth.     sildenafil (REVATIO) 20 MG tablet Take 3-5 tablets 30 minutes before intercourse. 30 tablet 6   traMADol (ULTRAM) 50 MG tablet Take 1 tablet (50 mg total) by mouth every 6 (six) hours as needed for severe pain. 15 tablet 0   zolpidem (AMBIEN) 10 MG tablet Take 10 mg by mouth at bedtime.     mesalamine (CANASA) 1000 MG suppository Place 1,000 mg rectally at bedtime.     polyethylene glycol (MIRALAX) 17 g packet Take 17 g by mouth daily as needed for mild constipation. 14 each 0   predniSONE (DELTASONE) 10 MG tablet Take 10 mg by mouth daily.     No facility-administered medications prior to visit.     Review of Systems:   Constitutional: No weight loss or gain, night sweats, fevers, chills, or lassitude. +excessive daytime fatigue HEENT: No headaches, difficulty swallowing, tooth/dental problems, or sore throat. No sneezing, itching, ear ache, nasal congestion, or post nasal drip CV:  No chest pain, orthopnea, PND, swelling in lower extremities, anasarca, dizziness, palpitations, syncope Resp: +snoring. No shortness of breath with exertion or at rest. No excess mucus or change in color of mucus. No productive or non-productive. No hemoptysis. No wheezing.  No chest wall deformity GI:  No heartburn, indigestion, abdominal pain, nausea, vomiting, diarrhea, change in bowel habits, loss of appetite, bloody stools.  GU: No dysuria, change in color of urine, urgency or frequency.  Skin: No rash, lesions, ulcerations MSK:  No joint pain or swelling.   Neuro: No dizziness or lightheadedness.  Psych: No depression or anxiety. Mood stable. +sleep disturbance    Physical Exam:  BP 130/80   Pulse 74   Ht 5' 9.5" (1.765 m)   Wt 184 lb 6.4 oz (83.6 kg)   SpO2 98%   BMI 26.84 kg/m   GEN: Pleasant, interactive, well-appearing; in no acute distress. HEENT:  Normocephalic and atraumatic. PERRLA. Sclera white. Nasal turbinates pink, moist and patent bilaterally. No rhinorrhea present.  Oropharynx pink and moist, without exudate or edema. No lesions, ulcerations, or postnasal drip.  NECK:  Supple w/ fair ROM. No JVD present. Normal carotid impulses w/o bruits. Thyroid symmetrical with no goiter or nodules palpated. No lymphadenopathy.   CV: RRR, no m/r/g, no peripheral edema. Pulses intact, +2 bilaterally. No cyanosis, pallor or clubbing. PULMONARY:  Unlabored, regular breathing. Clear bilaterally A&P w/o wheezes/rales/rhonchi. No accessory muscle use.  GI: BS present and normoactive. Soft, non-tender to palpation. No organomegaly or masses detected.  MSK: No erythema, warmth or tenderness. Cap refil <2 sec all extrem. No deformities or joint swelling noted.  Neuro: A/Ox3. No focal deficits noted.   Skin: Warm, no lesions or  rashe Psych: Normal affect and behavior. Judgement and thought content appropriate.     Lab Results:  CBC    Component Value Date/Time   WBC 9.3 10/11/2022 2220   RBC 4.84 10/11/2022 2220   HGB 15.1 10/11/2022 2220   HCT 43.2 10/11/2022 2220   PLT 186 10/11/2022 2220   MCV 89.3 10/11/2022 2220   MCH 31.2 10/11/2022 2220   MCHC 35.0 10/11/2022 2220   RDW 13.0 10/11/2022 2220   LYMPHSABS 1.6 10/11/2022 2220   MONOABS 0.8 10/11/2022 2220   EOSABS 0.2 10/11/2022 2220   BASOSABS 0.1 10/11/2022 2220    BMET    Component Value Date/Time   NA 140 10/11/2022 2220   K 3.7 10/11/2022 2220   CL 101 10/11/2022 2220   CO2 26 10/11/2022 2220   GLUCOSE 105 (H) 10/11/2022 2220   BUN 19 10/11/2022 2220   CREATININE 1.23 10/11/2022 2220   CALCIUM 9.4 10/11/2022 2220   GFRNONAA >60 10/11/2022 2220   GFRAA (L) 03/23/2011 1030    49        The eGFR has been calculated using the MDRD equation. This calculation has not been validated in all clinical situations. eGFR's persistently <60 mL/min signify possible Chronic Kidney Disease.    BNP No results found for: "BNP"   Imaging:  No results found.        No data to display           No results found for: "NITRICOXIDE"      Assessment & Plan:   OSA (obstructive sleep apnea) He has a history of OSA. Previous records unavailable for review. He was on CPAP but stopped using it 2 years ago. Willing to restart. Reviewed correlation between untreated OSA and insomnia. We can review alternative pharmacological therapy once we have his sleep study back.  He has snoring, excessive daytime sleepiness, restless sleep. Given this,  I am concerned he still has sleep disordered breathing with obstructive sleep apnea. He will need a repeat sleep study for further evaluation.    - discussed how weight can impact sleep and risk for sleep disordered breathing - discussed options to assist with weight loss: combination of diet modification, cardiovascular and strength training exercises   - had an extensive discussion regarding the adverse health consequences related to untreated sleep disordered breathing - specifically discussed the risks for hypertension, coronary artery disease, cardiac dysrhythmias, cerebrovascular disease, and diabetes - lifestyle modification discussed   - discussed how sleep disruption can increase risk of accidents, particularly when driving - safe driving practices were discussed  Patient Instructions  Given your symptoms and history, I am concerned that you may have sleep disordered breathing with sleep apnea. We have ordered a sleep study for further evaluation. Someone will contact you for scheduling this.   We discussed how untreated sleep apnea puts an individual at risk for cardiac arrhthymias, pulm HTN, DM, stroke and increases their risk for daytime accidents. We also briefly reviewed treatment options including weight loss, side sleeping position, oral appliance, CPAP therapy or referral to ENT for possible surgical options  Caution when driving and pull over if you become sleepy  Follow up after sleep study with Katie Vernis [place],NP to results and  review treatment options. If symptoms worsen, please contact office for sooner follow up    Insomnia Possibly related to untreated OSA; however, symptoms didn't seem to improve with PAP therapy. He did have significant life stressors around this time as well. He is currently on  ambien. He will continue this for now. We will wait on sleep study results and then discuss further pharmacological therapy if he continues to have poor sleep quality.    I spent 35 minutes of dedicated to the care of this patient on the date of this encounter to include pre-visit review of records, face-to-face time with the patient discussing conditions above, post visit ordering of testing, clinical documentation with the electronic health record, making appropriate referrals as documented, and communicating necessary findings to members of the patients care team.  Clayton Bibles, NP 12/15/2022  Pt aware and understands NP's role.

## 2022-12-14 NOTE — Patient Instructions (Addendum)
Given your symptoms and history, I am concerned that you may have sleep disordered breathing with sleep apnea. We have ordered a sleep study for further evaluation. Someone will contact you for scheduling this.   We discussed how untreated sleep apnea puts an individual at risk for cardiac arrhthymias, pulm HTN, DM, stroke and increases their risk for daytime accidents. We also briefly reviewed treatment options including weight loss, side sleeping position, oral appliance, CPAP therapy or referral to ENT for possible surgical options  Caution when driving and pull over if you become sleepy  Follow up after sleep study with Katie Keno Caraway,NP to results and review treatment options. If symptoms worsen, please contact office for sooner follow up

## 2022-12-15 ENCOUNTER — Encounter: Payer: Self-pay | Admitting: Nurse Practitioner

## 2022-12-15 DIAGNOSIS — G4733 Obstructive sleep apnea (adult) (pediatric): Secondary | ICD-10-CM | POA: Insufficient documentation

## 2022-12-15 DIAGNOSIS — G4700 Insomnia, unspecified: Secondary | ICD-10-CM | POA: Insufficient documentation

## 2022-12-15 NOTE — Assessment & Plan Note (Signed)
He has a history of OSA. Previous records unavailable for review. He was on CPAP but stopped using it 2 years ago. Willing to restart. Reviewed correlation between untreated OSA and insomnia. We can review alternative pharmacological therapy once we have his sleep study back.  He has snoring, excessive daytime sleepiness, restless sleep. Given this,  I am concerned he still has sleep disordered breathing with obstructive sleep apnea. He will need a repeat sleep study for further evaluation.    - discussed how weight can impact sleep and risk for sleep disordered breathing - discussed options to assist with weight loss: combination of diet modification, cardiovascular and strength training exercises   - had an extensive discussion regarding the adverse health consequences related to untreated sleep disordered breathing - specifically discussed the risks for hypertension, coronary artery disease, cardiac dysrhythmias, cerebrovascular disease, and diabetes - lifestyle modification discussed   - discussed how sleep disruption can increase risk of accidents, particularly when driving - safe driving practices were discussed  Patient Instructions  Given your symptoms and history, I am concerned that you may have sleep disordered breathing with sleep apnea. We have ordered a sleep study for further evaluation. Someone will contact you for scheduling this.   We discussed how untreated sleep apnea puts an individual at risk for cardiac arrhthymias, pulm HTN, DM, stroke and increases their risk for daytime accidents. We also briefly reviewed treatment options including weight loss, side sleeping position, oral appliance, CPAP therapy or referral to ENT for possible surgical options  Caution when driving and pull over if you become sleepy  Follow up after sleep study with Katie Larry Alcock,NP to results and review treatment options. If symptoms worsen, please contact office for sooner follow up

## 2022-12-15 NOTE — Assessment & Plan Note (Signed)
Possibly related to untreated OSA; however, symptoms didn't seem to improve with PAP therapy. He did have significant life stressors around this time as well. He is currently on [place]. He will continue this for now. We will wait on sleep study results and then discuss further pharmacological therapy if he continues to have poor sleep quality.

## 2022-12-20 ENCOUNTER — Telehealth: Payer: Self-pay | Admitting: Cardiology

## 2022-12-20 NOTE — Telephone Encounter (Signed)
FYI.  °Contacted patient regarding recall appointment, patient notified our office they did not wish to keep this appointment at this time.  Deleted recall from system. °

## 2023-01-12 ENCOUNTER — Ambulatory Visit (INDEPENDENT_AMBULATORY_CARE_PROVIDER_SITE_OTHER): Payer: Medicare Other

## 2023-01-12 DIAGNOSIS — T63441D Toxic effect of venom of bees, accidental (unintentional), subsequent encounter: Secondary | ICD-10-CM

## 2023-01-19 ENCOUNTER — Ambulatory Visit: Payer: Medicare Other

## 2023-01-24 DIAGNOSIS — M5136 Other intervertebral disc degeneration, lumbar region: Secondary | ICD-10-CM | POA: Diagnosis not present

## 2023-01-30 DIAGNOSIS — Z23 Encounter for immunization: Secondary | ICD-10-CM | POA: Diagnosis not present

## 2023-01-30 DIAGNOSIS — E1169 Type 2 diabetes mellitus with other specified complication: Secondary | ICD-10-CM | POA: Diagnosis not present

## 2023-01-30 DIAGNOSIS — K5190 Ulcerative colitis, unspecified, without complications: Secondary | ICD-10-CM | POA: Diagnosis not present

## 2023-01-30 DIAGNOSIS — I10 Essential (primary) hypertension: Secondary | ICD-10-CM | POA: Diagnosis not present

## 2023-01-30 DIAGNOSIS — Z1389 Encounter for screening for other disorder: Secondary | ICD-10-CM | POA: Diagnosis not present

## 2023-01-30 DIAGNOSIS — G4733 Obstructive sleep apnea (adult) (pediatric): Secondary | ICD-10-CM | POA: Diagnosis not present

## 2023-02-28 DIAGNOSIS — E7849 Other hyperlipidemia: Secondary | ICD-10-CM | POA: Diagnosis not present

## 2023-02-28 DIAGNOSIS — K5190 Ulcerative colitis, unspecified, without complications: Secondary | ICD-10-CM | POA: Diagnosis not present

## 2023-02-28 DIAGNOSIS — E1169 Type 2 diabetes mellitus with other specified complication: Secondary | ICD-10-CM | POA: Diagnosis not present

## 2023-02-28 DIAGNOSIS — G4733 Obstructive sleep apnea (adult) (pediatric): Secondary | ICD-10-CM | POA: Diagnosis not present

## 2023-02-28 DIAGNOSIS — I10 Essential (primary) hypertension: Secondary | ICD-10-CM | POA: Diagnosis not present

## 2023-03-01 DIAGNOSIS — Z79899 Other long term (current) drug therapy: Secondary | ICD-10-CM | POA: Diagnosis not present

## 2023-03-02 DIAGNOSIS — Z79899 Other long term (current) drug therapy: Secondary | ICD-10-CM | POA: Diagnosis not present

## 2023-03-08 ENCOUNTER — Encounter: Payer: Self-pay | Admitting: Cardiology

## 2023-03-08 ENCOUNTER — Ambulatory Visit: Payer: Medicare Other | Attending: Cardiology | Admitting: Cardiology

## 2023-03-08 VITALS — BP 130/78 | HR 72 | Ht 69.5 in | Wt 183.2 lb

## 2023-03-08 DIAGNOSIS — M9902 Segmental and somatic dysfunction of thoracic region: Secondary | ICD-10-CM | POA: Diagnosis not present

## 2023-03-08 DIAGNOSIS — I493 Ventricular premature depolarization: Secondary | ICD-10-CM

## 2023-03-08 DIAGNOSIS — M9903 Segmental and somatic dysfunction of lumbar region: Secondary | ICD-10-CM | POA: Diagnosis not present

## 2023-03-08 DIAGNOSIS — S233XXA Sprain of ligaments of thoracic spine, initial encounter: Secondary | ICD-10-CM | POA: Diagnosis not present

## 2023-03-08 DIAGNOSIS — S134XXA Sprain of ligaments of cervical spine, initial encounter: Secondary | ICD-10-CM | POA: Diagnosis not present

## 2023-03-08 DIAGNOSIS — I10 Essential (primary) hypertension: Secondary | ICD-10-CM | POA: Diagnosis not present

## 2023-03-08 DIAGNOSIS — M9901 Segmental and somatic dysfunction of cervical region: Secondary | ICD-10-CM | POA: Diagnosis not present

## 2023-03-08 DIAGNOSIS — S338XXA Sprain of other parts of lumbar spine and pelvis, initial encounter: Secondary | ICD-10-CM | POA: Diagnosis not present

## 2023-03-08 NOTE — Patient Instructions (Addendum)
Medication Instructions:  Continue all current medications.   Labwork: none  Testing/Procedures: none  Follow-Up: 6 months   Any Other Special Instructions Will Be Listed Below (If Applicable). Nurse visit in next week or so - bring your cuff for comparison.   If you need a refill on your cardiac medications before your next appointment, please call your pharmacy.

## 2023-03-08 NOTE — Progress Notes (Signed)
Clinical Summary Joseph Chase is a 67 y.o.male seen today for follow up of the following medical problems.      1. Dizziness - dizziness has improved since last visit  - was orthostatic at a prior clinic visit around time of symptoms  -07/2021 7 day zio patch by pcp: min HR 43, Max HR 190, avg HR 78. Rare supraventricular ectopy, occaional PVCs 1.9% burden. One runs SVT 5 beats.     2. Chest pain - 12/2020 nuclear stress [facility]: no ischemia, large apical/inferior scar, LVEF 43% thus listed as intermediate risk - 12/2020 [facility] echo LVEF 50%, grade I dd, mild MR  - no significant symptoms   3. Ulcerative colitis - he is on imuran       4. HTN - compliant with meds  - 150s/80s at home - 03/01/23 pcp added norvasc 5mg , changed losartan to PM.   toprol increased to 75mg   - of note Feb and today clinic visits bp 130/80 however home numbers have been running high.    Past Medical History:  Diagnosis Date   Borderline diabetes    Diabetes mellitus without complication (HCC)    Elevated cholesterol    Hypertension    Ulcerative colitis (HCC)      Allergies  Allergen Reactions   Venomil Mixed Vespid [Mixed Vespid Venom]    Venomil Wasp Venom [Wasp Venom]    Penicillins Rash     Current Outpatient Medications  Medication Sig Dispense Refill   acetaminophen (TYLENOL) 500 MG tablet Take 2 tablets (1,000 mg total) by mouth every 6 (six) hours as needed for mild pain. 30 tablet 0   azaTHIOprine (IMURAN) 50 MG tablet Take 200 mg by mouth daily.     co-enzyme Q-10 30 MG capsule Take 30 mg by mouth daily.     EPINEPHrine (EPIPEN 2-PAK) 0.3 mg/0.3 mL IJ SOAJ injection Inject 0.3 mg into the muscle daily as needed. For severe life-threatening allergic reaction 1 each 3   fexofenadine (ALLEGRA) 180 MG tablet Take 180 mg by mouth daily.     gabapentin (NEURONTIN) 300 MG capsule Take 1 capsule by mouth daily.     glipiZIDE (GLUCOTROL) 5 MG tablet Take 5 mg by mouth 2 (two)  times daily.     hydrochlorothiazide (HYDRODIURIL) 25 MG tablet Take 25 mg by mouth daily.     losartan (COZAAR) 100 MG tablet Take 100 mg by mouth daily.     mesalamine (CANASA) 1000 MG suppository Place 1,000 mg rectally at bedtime.     metFORMIN (GLUCOPHAGE-XR) 500 MG 24 hr tablet Take 500-1,000 mg by mouth 2 (two) times daily. 500 mg in the morning and 1000 mg in the evening     metoprolol succinate (TOPROL-XL) 25 MG 24 hr tablet Take 75 mg by mouth daily.     Multiple Vitamin (THERA) TABS Take 1 tablet by mouth daily.     Multiple Vitamins-Minerals (PRESERVISION AREDS 2 PO) Take by mouth in the morning and at bedtime.     polyethylene glycol (MIRALAX) 17 g packet Take 17 g by mouth daily as needed for mild constipation. 14 each 0   predniSONE (DELTASONE) 10 MG tablet Take 10 mg by mouth daily.     rosuvastatin (CRESTOR) 10 MG tablet Take by mouth.     sildenafil (REVATIO) 20 MG tablet Take 3-5 tablets 30 minutes before intercourse. 30 tablet 6   traMADol (ULTRAM) 50 MG tablet Take 1 tablet (50 mg total) by  mouth every 6 (six) hours as needed for severe pain. 15 tablet 0   zolpidem (AMBIEN) 10 MG tablet Take 10 mg by mouth at bedtime.     No current facility-administered medications for this visit.     Past Surgical History:  Procedure Laterality Date   BREAST MASS EXCISION Right October 12,2016   Per patient, non malignant   LAPAROSCOPIC APPENDECTOMY N/A 10/12/2022   Procedure: APPENDECTOMY LAPAROSCOPIC;  Surgeon: Axel Filler, MD;  Location: [facility] OR;  Service: General;  Laterality: N/A;   MEDIAL PARTIAL KNEE REPLACEMENT Right 6 yrs ago     Allergies  Allergen Reactions   Venomil Mixed Vespid [Mixed Vespid Venom]    Venomil Wasp Venom [Wasp Venom]    Penicillins Rash      Family History  Problem Relation Age of Onset   Alzheimer's disease Mother    Heart attack Father    Stroke Father    Allergic rhinitis Neg Hx    Angioedema Neg Hx    Asthma Neg Hx    Eczema Neg  Hx    Immunodeficiency Neg Hx    Urticaria Neg Hx      Social History Joseph Chase reports that he has never smoked. He has never used smokeless tobacco. Joseph Chase reports no history of alcohol use.   Review of Systems CONSTITUTIONAL: No weight loss, fever, chills, weakness or fatigue.  HEENT: Eyes: No visual loss, blurred vision, double vision or yellow sclerae.No hearing loss, sneezing, congestion, runny nose or sore throat.  SKIN: No rash or itching.  CARDIOVASCULAR: per hpi RESPIRATORY: No shortness of breath, cough or sputum.  GASTROINTESTINAL: No anorexia, nausea, vomiting or diarrhea. No abdominal pain or blood.  GENITOURINARY: No burning on urination, no polyuria NEUROLOGICAL: No headache, dizziness, syncope, paralysis, ataxia, numbness or tingling in the extremities. No change in bowel or bladder control.  MUSCULOSKELETAL: No muscle, back pain, joint pain or stiffness.  LYMPHATICS: No enlarged nodes. No history of splenectomy.  PSYCHIATRIC: No history of depression or anxiety.  ENDOCRINOLOGIC: No reports of sweating, cold or heat intolerance. No polyuria or polydipsia.  Marland Kitchen   Physical Examination Today's Vitals   03/08/23 0829  BP: 130/78  Pulse: 72  SpO2: 98%  Weight: 183 lb 3.2 oz (83.1 kg)  Height: 5' 9.5" (1.765 m)   Body mass index is 26.67 kg/m.  Gen: resting comfortably, no acute distress HEENT: no scleral icterus, pupils equal round and reactive, no palptable cervical adenopathy,  CV: RRR, no mrg, no jvd Resp: Clear to auscultation bilaterally GI: abdomen is soft, non-tender, non-distended, normal bowel sounds, no hepatosplenomegaly MSK: extremities are warm, no edema.  Skin: warm, no rash Neuro:  no focal deficits Psych: appropriate affect   Diagnostic Studies  12/2019 [facility] Echo  Summary  1. The left ventricle is upper normal in size with normal wall thickness.  2. The left ventricular systolic function is borderline, LVEF is  visually estimated at 50%.  3. There is grade I diastolic dysfunction (impaired relaxation).  4. There is mild mitral valve regurgitation.  5. The left atrium is mildly dilated in size.  6. The right ventricle is normal in size, with normal systolic function.   12/2019 nuclear stress 1. No reversible ischemia. Large scar involving the apex and  inferior wall..   2. Normal left ventricular wall motion. Mild LEFT ventricular  dilatation   3. Left ventricular ejection fraction 43%   4. Non invasive risk stratification*: intermediate (based on low  ejection fraction)  Assessment and Plan  1.HTN - recent high bp's by home cuff. His numbers in our clinic and pulmonary clinic were fine.  - wll have him bring home cuff in to confirm accuracy. If accurate could change HCTZ to chlorthalidone for more potent bp effect, room to titrate norvasc, could also consider changing toprol to more bp effective beta blocker - if significant ongoing HTN could consider secondary HTN workup. Already being worked up for OSA by pulm.  2. PVCs - no recent symptoms, continue toprol      Antoine Poche, M.D

## 2023-03-19 ENCOUNTER — Encounter: Payer: Self-pay | Admitting: *Deleted

## 2023-03-19 ENCOUNTER — Ambulatory Visit: Payer: Medicare Other | Attending: Internal Medicine | Admitting: *Deleted

## 2023-03-19 VITALS — BP 151/83 | HR 62 | Ht 69.5 in | Wt 183.5 lb

## 2023-03-19 DIAGNOSIS — I10 Essential (primary) hypertension: Secondary | ICD-10-CM | POA: Diagnosis not present

## 2023-03-19 NOTE — Patient Instructions (Signed)
Your physician recommends that you continue on your current medications as directed. Please refer to the Current Medication list given to you today.  We will contact you after your provider reviews your visit today.

## 2023-03-19 NOTE — Progress Notes (Signed)
Presents for blood pressure check to compare home monitor. Home BP readings brought to office for review by provider. Reports taking all doses of medications as prescribed without side effects or missed doses. Denies dizziness, chest pain or SOB. Medications reviewed, vitals taken and routed to provider for review.   Home BP upper arm cuff reading (left arm/sitting)-155 91 & HR 70 Home BP wrist cuff reading (left arm/siting)-174/110

## 2023-03-20 MED ORDER — AMLODIPINE BESYLATE 10 MG PO TABS: 10.0000 mg | ORAL_TABLET | Freq: Every day | ORAL | 3 refills | Status: DC

## 2023-03-20 NOTE — Telephone Encounter (Signed)
-----   Message from Antoine Poche, MD sent at 03/20/2023  1:30 PM EDT -----    ----- Message ----- From: Eustace Moore, RN Sent: 03/19/2023   8:36 AM EDT To: Antoine Poche, MD

## 2023-03-20 NOTE — Telephone Encounter (Signed)
Patient informed and verbalized understanding of plan. 

## 2023-03-20 NOTE — Progress Notes (Signed)
Arm cuff is close to ours, wrist cuff appears in accurate. Overall appears bp's are above goal, can increase norvasc to 10mg  daily and update [place] in 2 weeks on home bp's with arm cuff not wrist cuff  Dominga Ferry MD

## 2023-03-20 NOTE — Progress Notes (Signed)
Mychart message sent.

## 2023-03-20 NOTE — Addendum Note (Signed)
Addended by: Eustace Moore on: 03/20/2023 01:52 PM   Modules accepted: Orders

## 2023-03-21 DIAGNOSIS — I10 Essential (primary) hypertension: Secondary | ICD-10-CM | POA: Diagnosis not present

## 2023-03-21 DIAGNOSIS — K5190 Ulcerative colitis, unspecified, without complications: Secondary | ICD-10-CM | POA: Diagnosis not present

## 2023-03-21 DIAGNOSIS — E7849 Other hyperlipidemia: Secondary | ICD-10-CM | POA: Diagnosis not present

## 2023-03-21 DIAGNOSIS — E1169 Type 2 diabetes mellitus with other specified complication: Secondary | ICD-10-CM | POA: Diagnosis not present

## 2023-03-21 DIAGNOSIS — G4733 Obstructive sleep apnea (adult) (pediatric): Secondary | ICD-10-CM | POA: Diagnosis not present

## 2023-03-22 DIAGNOSIS — L570 Actinic keratosis: Secondary | ICD-10-CM | POA: Diagnosis not present

## 2023-03-22 DIAGNOSIS — X32XXXD Exposure to sunlight, subsequent encounter: Secondary | ICD-10-CM | POA: Diagnosis not present

## 2023-04-05 ENCOUNTER — Encounter: Payer: Self-pay | Admitting: Cardiology

## 2023-04-06 ENCOUNTER — Ambulatory Visit (INDEPENDENT_AMBULATORY_CARE_PROVIDER_SITE_OTHER): Payer: Medicare Other

## 2023-04-06 DIAGNOSIS — T63441D Toxic effect of venom of bees, accidental (unintentional), subsequent encounter: Secondary | ICD-10-CM | POA: Diagnosis not present

## 2023-04-10 ENCOUNTER — Other Ambulatory Visit: Payer: Self-pay | Admitting: *Deleted

## 2023-04-10 DIAGNOSIS — I10 Essential (primary) hypertension: Secondary | ICD-10-CM

## 2023-04-10 DIAGNOSIS — I493 Ventricular premature depolarization: Secondary | ICD-10-CM

## 2023-04-10 DIAGNOSIS — Z79899 Other long term (current) drug therapy: Secondary | ICD-10-CM

## 2023-04-10 MED ORDER — CHLORTHALIDONE 25 MG PO TABS: 25.0000 mg | ORAL_TABLET | Freq: Every day | ORAL | 6 refills | Status: DC

## 2023-04-10 NOTE — Telephone Encounter (Signed)
BP's too high, can he stop HCTZ and start chlorothalidone 25mg  daily, needs bmet/mg in 2 weeks. Update [place] on bp's 2 weeks   Dominga Ferry MD

## 2023-04-24 DIAGNOSIS — Z79899 Other long term (current) drug therapy: Secondary | ICD-10-CM | POA: Diagnosis not present

## 2023-04-24 DIAGNOSIS — I10 Essential (primary) hypertension: Secondary | ICD-10-CM | POA: Diagnosis not present

## 2023-04-24 DIAGNOSIS — I493 Ventricular premature depolarization: Secondary | ICD-10-CM | POA: Diagnosis not present

## 2023-04-25 LAB — BASIC METABOLIC PANEL
BUN/Creatinine Ratio: 16 (ref 10–24)
BUN: 20 mg/dL (ref 8–27)
CO2: 28 mmol/L (ref 20–29)
Calcium: 10 mg/dL (ref 8.6–10.2)
Chloride: 97 mmol/L (ref 96–106)
Creatinine, Ser: 1.22 mg/dL (ref 0.76–1.27)
Glucose: 164 mg/dL — ABNORMAL HIGH (ref 70–99)
Potassium: 3.9 mmol/L (ref 3.5–5.2)
Sodium: 138 mmol/L (ref 134–144)
eGFR: 65 mL/min/{1.73_m2} (ref >59)

## 2023-04-25 LAB — MAGNESIUM: Magnesium: 2 mg/dL (ref 1.6–2.3)

## 2023-04-27 ENCOUNTER — Encounter: Payer: Self-pay | Admitting: *Deleted

## 2023-04-27 DIAGNOSIS — E1165 Type 2 diabetes mellitus with hyperglycemia: Secondary | ICD-10-CM | POA: Diagnosis not present

## 2023-04-27 DIAGNOSIS — I10 Essential (primary) hypertension: Secondary | ICD-10-CM | POA: Diagnosis not present

## 2023-05-01 DIAGNOSIS — I10 Essential (primary) hypertension: Secondary | ICD-10-CM | POA: Diagnosis not present

## 2023-05-01 DIAGNOSIS — E1169 Type 2 diabetes mellitus with other specified complication: Secondary | ICD-10-CM | POA: Diagnosis not present

## 2023-05-01 DIAGNOSIS — E7849 Other hyperlipidemia: Secondary | ICD-10-CM | POA: Diagnosis not present

## 2023-05-01 DIAGNOSIS — G4733 Obstructive sleep apnea (adult) (pediatric): Secondary | ICD-10-CM | POA: Diagnosis not present

## 2023-05-01 DIAGNOSIS — K5190 Ulcerative colitis, unspecified, without complications: Secondary | ICD-10-CM | POA: Diagnosis not present

## 2023-05-31 DIAGNOSIS — S134XXA Sprain of ligaments of cervical spine, initial encounter: Secondary | ICD-10-CM | POA: Diagnosis not present

## 2023-05-31 DIAGNOSIS — S233XXA Sprain of ligaments of thoracic spine, initial encounter: Secondary | ICD-10-CM | POA: Diagnosis not present

## 2023-05-31 DIAGNOSIS — S338XXA Sprain of other parts of lumbar spine and pelvis, initial encounter: Secondary | ICD-10-CM | POA: Diagnosis not present

## 2023-05-31 DIAGNOSIS — M9903 Segmental and somatic dysfunction of lumbar region: Secondary | ICD-10-CM | POA: Diagnosis not present

## 2023-05-31 DIAGNOSIS — M9901 Segmental and somatic dysfunction of cervical region: Secondary | ICD-10-CM | POA: Diagnosis not present

## 2023-05-31 DIAGNOSIS — M9902 Segmental and somatic dysfunction of thoracic region: Secondary | ICD-10-CM | POA: Diagnosis not present

## 2023-06-05 DIAGNOSIS — M5136 Other intervertebral disc degeneration, lumbar region: Secondary | ICD-10-CM | POA: Diagnosis not present

## 2023-06-05 DIAGNOSIS — M48061 Spinal stenosis, lumbar region without neurogenic claudication: Secondary | ICD-10-CM | POA: Diagnosis not present

## 2023-06-27 ENCOUNTER — Ambulatory Visit (INDEPENDENT_AMBULATORY_CARE_PROVIDER_SITE_OTHER): Payer: Medicare Other

## 2023-06-27 DIAGNOSIS — T63441D Toxic effect of venom of bees, accidental (unintentional), subsequent encounter: Secondary | ICD-10-CM | POA: Diagnosis not present

## 2023-07-24 DIAGNOSIS — K5090 Crohn's disease, unspecified, without complications: Secondary | ICD-10-CM | POA: Diagnosis not present

## 2023-07-24 DIAGNOSIS — R5382 Chronic fatigue, unspecified: Secondary | ICD-10-CM | POA: Diagnosis not present

## 2023-07-24 DIAGNOSIS — E1165 Type 2 diabetes mellitus with hyperglycemia: Secondary | ICD-10-CM | POA: Diagnosis not present

## 2023-07-24 DIAGNOSIS — E1169 Type 2 diabetes mellitus with other specified complication: Secondary | ICD-10-CM | POA: Diagnosis not present

## 2023-07-31 DIAGNOSIS — K5190 Ulcerative colitis, unspecified, without complications: Secondary | ICD-10-CM | POA: Diagnosis not present

## 2023-07-31 DIAGNOSIS — G4733 Obstructive sleep apnea (adult) (pediatric): Secondary | ICD-10-CM | POA: Diagnosis not present

## 2023-07-31 DIAGNOSIS — M48061 Spinal stenosis, lumbar region without neurogenic claudication: Secondary | ICD-10-CM | POA: Diagnosis not present

## 2023-07-31 DIAGNOSIS — M47816 Spondylosis without myelopathy or radiculopathy, lumbar region: Secondary | ICD-10-CM | POA: Diagnosis not present

## 2023-07-31 DIAGNOSIS — E7849 Other hyperlipidemia: Secondary | ICD-10-CM | POA: Diagnosis not present

## 2023-07-31 DIAGNOSIS — E1169 Type 2 diabetes mellitus with other specified complication: Secondary | ICD-10-CM | POA: Diagnosis not present

## 2023-07-31 DIAGNOSIS — M5136 Other intervertebral disc degeneration, lumbar region: Secondary | ICD-10-CM | POA: Diagnosis not present

## 2023-07-31 DIAGNOSIS — I10 Essential (primary) hypertension: Secondary | ICD-10-CM | POA: Diagnosis not present

## 2023-07-31 DIAGNOSIS — Z23 Encounter for immunization: Secondary | ICD-10-CM | POA: Diagnosis not present

## 2023-07-31 DIAGNOSIS — M4316 Spondylolisthesis, lumbar region: Secondary | ICD-10-CM | POA: Diagnosis not present

## 2023-08-09 DIAGNOSIS — X32XXXD Exposure to sunlight, subsequent encounter: Secondary | ICD-10-CM | POA: Diagnosis not present

## 2023-08-09 DIAGNOSIS — B078 Other viral warts: Secondary | ICD-10-CM | POA: Diagnosis not present

## 2023-08-09 DIAGNOSIS — L570 Actinic keratosis: Secondary | ICD-10-CM | POA: Diagnosis not present

## 2023-08-09 DIAGNOSIS — C44319 Basal cell carcinoma of skin of other parts of face: Secondary | ICD-10-CM | POA: Diagnosis not present

## 2023-08-17 DIAGNOSIS — G4733 Obstructive sleep apnea (adult) (pediatric): Secondary | ICD-10-CM

## 2023-08-17 NOTE — Telephone Encounter (Signed)
Mychart message sent by pt:  [facility] (supporting Micheline Maze V, NP)6 hours ago (8:31 AM)    was in office Dec 14, 2022 . Was told needed sleep study at home. Someone would contact me. Might take as long as 90 days before could get equipment to do study.  Well it 08/17/2023 and have heard nothing   Checked pt's chart at last OV and saw that no order for sleep study had ever been placed.   I have placed the order for the sleep study. Since pt has been waiting since February 2024, is there any way we can get this handled sooner for pt?

## 2023-09-04 ENCOUNTER — Ambulatory Visit: Payer: Medicare Other

## 2023-09-04 ENCOUNTER — Telehealth: Payer: Self-pay | Admitting: Nurse Practitioner

## 2023-09-04 DIAGNOSIS — G4733 Obstructive sleep apnea (adult) (pediatric): Secondary | ICD-10-CM

## 2023-09-04 NOTE — Telephone Encounter (Signed)
When pt HST results come in Please contact Pt his follow up appt is not until 12/16

## 2023-09-04 NOTE — Telephone Encounter (Signed)
ATC patient x3--received busy signal.  It does not appear that HST has been read yet.

## 2023-09-12 ENCOUNTER — Telehealth: Payer: Self-pay | Admitting: Pulmonary Disease

## 2023-09-12 ENCOUNTER — Encounter: Payer: Self-pay | Admitting: Cardiology

## 2023-09-12 ENCOUNTER — Ambulatory Visit: Payer: Medicare Other | Attending: Cardiology | Admitting: Cardiology

## 2023-09-12 VITALS — BP 130/80 | HR 74 | Ht 69.5 in | Wt 184.8 lb

## 2023-09-12 DIAGNOSIS — G4733 Obstructive sleep apnea (adult) (pediatric): Secondary | ICD-10-CM | POA: Diagnosis not present

## 2023-09-12 DIAGNOSIS — I493 Ventricular premature depolarization: Secondary | ICD-10-CM | POA: Diagnosis not present

## 2023-09-12 DIAGNOSIS — E782 Mixed hyperlipidemia: Secondary | ICD-10-CM | POA: Diagnosis not present

## 2023-09-12 DIAGNOSIS — I10 Essential (primary) hypertension: Secondary | ICD-10-CM

## 2023-09-12 NOTE — Progress Notes (Signed)
Clinical Summary [name] is a 67 y.o.male seen today for follow up of the following medical problems.      1. Dizziness - dizziness has improved since last visit  - was orthostatic at a prior clinic visit around time of symptoms  -07/2021 7 day zio patch by pcp: min HR 43, Max HR 190, avg HR 78. Rare supraventricular ectopy, occaional PVCs 1.9% burden. One runs SVT 5 beats.   - no recent symptoms     2. Chest pain - 12/2020 nuclear stress [facility]: no ischemia, large apical/inferior scar, LVEF 43% thus listed as intermediate risk - 12/2020 [facility] echo LVEF 50%, grade I dd, mild MR   - denies any recent symptoms   3. Ulcerative colitis - he is on imuran       4. HTN - compliant with meds   - 150s/80s at home - 03/01/23 pcp added norvasc 5mg , changed losartan to PM.   toprol increased to 75mg   - of note Feb and today clinic visits bp 130/80 however home numbers have been running high.   -home arm cuff was similar to ours, home bp wrist cuff appeared inaccurate.  - we icnreased norvasc to 10mg  daily and then changed hydrochlorothiazide to chlorthalidone  - compliant with meds - completed home sleep study about 1 week ago   5. 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  Past Medical History:  Diagnosis Date   Borderline diabetes    Diabetes mellitus without complication (HCC)    Elevated cholesterol    Hypertension    Ulcerative colitis (HCC)      Allergies  Allergen Reactions   Venomil Mixed Vespid [Mixed Vespid Venom]    Venomil Wasp Venom [Wasp Venom]    Penicillins Rash     Current Outpatient Medications  Medication Sig Dispense Refill   acetaminophen (TYLENOL) 500 MG tablet Take 2 tablets (1,000 mg total) by mouth every 6 (six) hours as needed for mild pain. 30 tablet 0   amLODipine (NORVASC) 10 MG tablet Take 1 tablet (10 mg total) by mouth  daily. 90 tablet 3   azaTHIOprine (IMURAN) 50 MG tablet Take 200 mg by mouth daily.     chlorthalidone (HYGROTON) 25 MG tablet Take 1 tablet (25 mg total) by mouth daily. 30 tablet 6   Coenzyme Q10 (CO Q 10) 100 MG CAPS Take 1 capsule by mouth daily.     EPINEPHrine (EPIPEN 2-PAK) 0.3 mg/0.3 mL IJ SOAJ injection Inject 0.3 mg into the muscle daily as needed. For severe life-threatening allergic reaction 1 each 3   fexofenadine (ALLEGRA) 180 MG tablet Take 180 mg by mouth daily.     gabapentin (NEURONTIN) 100 MG capsule Take 200 mg by mouth at bedtime.     glipiZIDE (GLUCOTROL) 5 MG tablet Take 5 mg by mouth 2 (two) times daily.     losartan (COZAAR) 100 MG tablet Take 100 mg by mouth daily.     metFORMIN (GLUCOPHAGE-XR) 500 MG 24 hr tablet Take 500 mg by mouth 2 (two) times daily with a meal.     metoprolol succinate (TOPROL-XL) 25 MG 24 hr tablet Take 75 mg by mouth daily.     Multiple Vitamin (THERA) TABS Take 1 tablet by mouth daily.     Multiple Vitamins-Minerals (PRESERVISION AREDS) TABS Take 1 tablet by mouth 2 (  two) times daily.     rosuvastatin (CRESTOR) 10 MG tablet Take 10 mg by mouth daily.     sildenafil (REVATIO) 20 MG tablet Take 3-5 tablets 30 minutes before intercourse. 30 tablet 6   zolpidem (AMBIEN) 10 MG tablet Take 10 mg by mouth at bedtime.     No current facility-administered medications for this visit.     Past Surgical History:  Procedure Laterality Date   BREAST MASS EXCISION Right October 12,2016   Per patient, non malignant   LAPAROSCOPIC APPENDECTOMY N/A 10/12/2022   Procedure: APPENDECTOMY LAPAROSCOPIC;  Surgeon: Axel Filler, MD;  Location: [facility] OR;  Service: General;  Laterality: N/A;   MEDIAL PARTIAL KNEE REPLACEMENT Right 6 yrs ago     Allergies  Allergen Reactions   Venomil Mixed Vespid [Mixed Vespid Venom]    Venomil Wasp Venom [Wasp Venom]    Penicillins Rash      Family History  Problem Relation Age of Onset   Alzheimer's disease Mother     Heart attack Father    Stroke Father    Allergic rhinitis Neg Hx    Angioedema Neg Hx    Asthma Neg Hx    Eczema Neg Hx    Immunodeficiency Neg Hx    Urticaria Neg Hx      Social History [name] reports that he has never smoked. He has never used smokeless tobacco. [name] reports no history of alcohol use.   Review of Systems CONSTITUTIONAL: No weight loss, fever, chills, weakness or fatigue.  HEENT: Eyes: No visual loss, blurred vision, double vision or yellow sclerae.No hearing loss, sneezing, congestion, runny nose or sore throat.  SKIN: No rash or itching.  CARDIOVASCULAR: per hpi RESPIRATORY: No shortness of breath, cough or sputum.  GASTROINTESTINAL: No anorexia, nausea, vomiting or diarrhea. No abdominal pain or blood.  GENITOURINARY: No burning on urination, no polyuria NEUROLOGICAL: No headache, dizziness, syncope, paralysis, ataxia, numbness or tingling in the extremities. No change in bowel or bladder control.  MUSCULOSKELETAL: No muscle, back pain, joint pain or stiffness.  LYMPHATICS: No enlarged nodes. No history of splenectomy.  PSYCHIATRIC: No history of depression or anxiety.  ENDOCRINOLOGIC: No reports of sweating, cold or heat intolerance. No polyuria or polydipsia.  Marland Kitchen   Physical Examination Today's Vitals   09/12/23 0802  BP: 130/80  Pulse: 74  SpO2: 96%  Weight: 184 lb 12.8 oz (83.8 kg)  Height: 5' 9.5" (1.765 m)   Body mass index is 26.9 kg/m.  Gen: resting comfortably, no acute distress HEENT: no scleral icterus, pupils equal round and reactive, no palptable cervical adenopathy,  CV: RRR, no m/rg, no jvd Resp: Clear to auscultation bilaterally GI: abdomen is soft, non-tender, non-distended, normal bowel sounds, no hepatosplenomegaly MSK: extremities are warm, no edema.  Skin: warm, no rash Neuro:  no focal deficits Psych: appropriate affect   Diagnostic Studies  12/2019 [facility] Echo  Summary  1. The left ventricle is  upper normal in size with normal wall thickness.  2. The left ventricular systolic function is borderline, LVEF is visually estimated at 50%.  3. There is grade I diastolic dysfunction (impaired relaxation).  4. There is mild mitral valve regurgitation.  5. The left atrium is mildly dilated in size.  6. The right ventricle is normal in size, with normal systolic function.   12/2019 nuclear stress 1. No reversible ischemia. Large scar involving the apex and  inferior wall..   2. Normal left ventricular wall motion. Mild LEFT ventricular  dilatation   3. Left ventricular ejection fraction 43%   4. Non invasive risk stratification*: intermediate (based on low  ejection fraction)        Assessment and Plan   1.HTN - at goal, continue current meds   2. PVCs - no significant symptoms, continue current meds   3. HLD - at goal, continue current meds  F/u 1 year    Antoine Poche, M.D.

## 2023-09-12 NOTE — Telephone Encounter (Signed)
Call patient  Sleep study result  Date of study: 09/04/2023  Impression: Moderate obstructive sleep apnea with moderate oxygen desaturations  Recommendation: DME referral  Recommend CPAP therapy for moderate obstructive sleep apnea  Auto titrating CPAP with pressure settings of 5-15 will be appropriate  Encourage weight loss measures  Follow-up in the office 4 to 6 weeks following initiation of treatment

## 2023-09-12 NOTE — Patient Instructions (Addendum)

## 2023-09-19 ENCOUNTER — Ambulatory Visit (INDEPENDENT_AMBULATORY_CARE_PROVIDER_SITE_OTHER): Payer: Medicare Other

## 2023-09-19 DIAGNOSIS — T63441D Toxic effect of venom of bees, accidental (unintentional), subsequent encounter: Secondary | ICD-10-CM

## 2023-09-20 DIAGNOSIS — Z08 Encounter for follow-up examination after completed treatment for malignant neoplasm: Secondary | ICD-10-CM | POA: Diagnosis not present

## 2023-09-20 DIAGNOSIS — C44219 Basal cell carcinoma of skin of left ear and external auricular canal: Secondary | ICD-10-CM | POA: Diagnosis not present

## 2023-09-20 DIAGNOSIS — C44319 Basal cell carcinoma of skin of other parts of face: Secondary | ICD-10-CM | POA: Diagnosis not present

## 2023-09-20 DIAGNOSIS — X32XXXD Exposure to sunlight, subsequent encounter: Secondary | ICD-10-CM | POA: Diagnosis not present

## 2023-09-20 DIAGNOSIS — L570 Actinic keratosis: Secondary | ICD-10-CM | POA: Diagnosis not present

## 2023-09-20 DIAGNOSIS — Z85828 Personal history of other malignant neoplasm of skin: Secondary | ICD-10-CM | POA: Diagnosis not present

## 2023-10-04 DIAGNOSIS — H40053 Ocular hypertension, bilateral: Secondary | ICD-10-CM | POA: Diagnosis not present

## 2023-10-09 ENCOUNTER — Other Ambulatory Visit: Payer: Self-pay | Admitting: Cardiology

## 2023-10-23 DIAGNOSIS — M51369 Other intervertebral disc degeneration, lumbar region without mention of lumbar back pain or lower extremity pain: Secondary | ICD-10-CM | POA: Diagnosis not present

## 2023-10-23 DIAGNOSIS — M48061 Spinal stenosis, lumbar region without neurogenic claudication: Secondary | ICD-10-CM | POA: Diagnosis not present

## 2023-10-23 DIAGNOSIS — M47816 Spondylosis without myelopathy or radiculopathy, lumbar region: Secondary | ICD-10-CM | POA: Diagnosis not present

## 2023-10-26 DIAGNOSIS — R5382 Chronic fatigue, unspecified: Secondary | ICD-10-CM | POA: Diagnosis not present

## 2023-10-26 DIAGNOSIS — E1165 Type 2 diabetes mellitus with hyperglycemia: Secondary | ICD-10-CM | POA: Diagnosis not present

## 2023-10-26 DIAGNOSIS — K5090 Crohn's disease, unspecified, without complications: Secondary | ICD-10-CM | POA: Diagnosis not present

## 2023-10-26 DIAGNOSIS — E1169 Type 2 diabetes mellitus with other specified complication: Secondary | ICD-10-CM | POA: Diagnosis not present

## 2023-10-29 ENCOUNTER — Telehealth: Payer: Medicare Other | Admitting: Nurse Practitioner

## 2023-10-29 ENCOUNTER — Encounter: Payer: Self-pay | Admitting: Nurse Practitioner

## 2023-10-29 DIAGNOSIS — G4733 Obstructive sleep apnea (adult) (pediatric): Secondary | ICD-10-CM

## 2023-10-29 NOTE — Progress Notes (Signed)
Patient ID: Joseph Chase, male     DOB: 12/07/1955, 67 y.o.      MRN: [id number]  No chief complaint on file.   Virtual Visit via Video Note  I connected with [facility] on 10/29/23 at  2:30 PM EST by a video enabled telemedicine application and verified that I am speaking with the correct person using two identifiers.  Location: Patient: Home Provider: Office   I discussed the limitations of evaluation and management by telemedicine and the availability of in person appointments. The patient expressed understanding and agreed to proceed.  History of Present Illness: 67 year old male, never smoker followed for sleep apnea. Past medical history significant for    TEST/EVENTS:  09/05/2023 HST: AHI 15.7/h, SpO2 low 63%   12/14/2022: OV with Elliyah Liszewski NP for sleep consult. Diagnosed with OSA years ago and was on CPAP; however, his wife became ill around 5 years ago and then after caring for her, he stopped wearing his machine about 2 years ago. She unfortunately passed around the same time. Since then, he doesn't necessarily feel like his daytime sleepiness and sleep are much different. He's struggled with his sleep for a long time. He has been on trazodone and ambien. He has trouble falling and staying asleep. Despite CPAP therapy, he still had restless sleep and wakes up feeling poorly rested. He did have a lot of life stressors at the time. He was told previously that he snored. Never told he stopped breathing. He's unable to nap or fall asleep during the day, regardless of how tired he is. He denies current trouble with morning dry mouth, headaches, sleep parasomnias/paralysis. No history of narcolepsy or cataplexy.  He goes to bed around 11-11:30 pm. With ambien, falls asleep within 15 min. This tends to only keep him asleep for around 4-5 hours. Without it, he will wake up 5-6 times a night. Gets out of bed around 7:30 am. His previous sleep study was at a [facility] facility in [place]. He  does not remember the office or the provider. He thinks he had either mild or moderate OSA. He is down 10 lb over the past two years. He has a history of HTN and DM. No history of stroke. Works in Systems [employer]. 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 He is a never smoker. Drinks 6-10 beers a week. Lives by himself. Family history of heart disease.  Epworth 0  10/29/2023: Today - follow up Patient presents today for follow-up to discuss home sleep study results which revealed moderate sleep apnea with severe oxygen desaturations.  He is still having difficulties with his sleep at night.  Feels like it is always restless.  Wakes feeling poorly rested.  Has tried multiple sleep aids.  Still tends to wake up throughout the night.  Denies any drowsy driving, sleep parasomnia/paralysis, morning headaches.  Allergies  Allergen Reactions   Venomil Mixed Vespid [Mixed Vespid Venom]    Venomil Wasp Venom [Wasp Venom]    Penicillins Rash   Immunization History  Administered Date(s) Administered   Moderna Sars-Covid-2 Vaccination 12/17/2019, 01/14/2020   Past Medical History:  Diagnosis Date   Borderline diabetes    Diabetes mellitus without complication (HCC)    Elevated cholesterol    Hypertension    Ulcerative colitis (HCC)     Tobacco History: Social History   Tobacco Use  Smoking Status Never  Smokeless Tobacco Never   Counseling given: Not Answered   Outpatient Medications Prior to Visit  Medication Sig Dispense Refill   acetaminophen (TYLENOL) 500 MG tablet Take 2 tablets (1,000 mg total) by mouth every 6 (six) hours as needed for mild pain. 30 tablet 0   amLODipine (NORVASC) 10 MG tablet Take 1 tablet (10 mg total) by mouth daily. 90 tablet 3   azaTHIOprine (IMURAN) 50 MG tablet Take 200 mg by mouth daily.     chlorthalidone (HYGROTON) 25 MG tablet take 1 tablet (25 MILLIGRAM total) by mouth daily. 90 tablet 2   Coenzyme Q10 (CO Q 10) 100 MG CAPS Take 1 capsule by mouth daily.      EPINEPHrine (EPIPEN 2-PAK) 0.3 mg/0.3 mL IJ SOAJ injection Inject 0.3 mg into the muscle daily as needed. For severe life-threatening allergic reaction 1 each 3   fexofenadine (ALLEGRA) 180 MG tablet Take 180 mg by mouth daily.     gabapentin (NEURONTIN) 100 MG capsule Take 200 mg by mouth at bedtime.     glipiZIDE (GLUCOTROL) 5 MG tablet Take 5 mg by mouth 2 (two) times daily.     losartan (COZAAR) 100 MG tablet Take 100 mg by mouth daily.     metFORMIN (GLUCOPHAGE-XR) 500 MG 24 hr tablet Take 500 mg by mouth 2 (two) times daily with a meal.     metoprolol succinate (TOPROL-XL) 25 MG 24 hr tablet Take 75 mg by mouth daily.     Multiple Vitamin (THERA) TABS Take 1 tablet by mouth daily.     rosuvastatin (CRESTOR) 10 MG tablet Take 10 mg by mouth daily.     sildenafil (REVATIO) 20 MG tablet Take 3-5 tablets 30 minutes before intercourse. 30 tablet 6   zolpidem (AMBIEN) 10 MG tablet Take 10 mg by mouth at bedtime.     No facility-administered medications prior to visit.     Review of Systems:   Constitutional: No weight loss or gain, night sweats, fevers, chills, or lassitude. +excessive daytime fatigue HEENT: No headaches, difficulty swallowing, tooth/dental problems, or sore throat. No sneezing, itching, ear ache, nasal congestion, or post nasal drip CV:  No chest pain, orthopnea, PND, swelling in lower extremities, anasarca, dizziness, palpitations, syncope Resp: +snoring. No shortness of breath with exertion or at rest. No excess mucus or change in color of mucus. No productive or non-productive. No hemoptysis. No wheezing.  No chest wall deformity GI:  No heartburn, indigestion, abdominal pain, nausea, vomiting, diarrhea, change in bowel habits, loss of appetite, bloody stools.  GU: No dysuria, change in color of urine, urgency or frequency.  Skin: No rash, lesions, ulcerations MSK:  No joint pain or swelling.   Neuro: No dizziness or lightheadedness.  Psych: No depression or anxiety.  Mood stable. +sleep disturbance  Observations/Objective: Patient is well-developed, well-nourished in no acute distress. A&Ox3. Resting comfortably at home. Unlabored breathing. Speech is clear and coherent with logical content.    Assessment and Plan: OSA (obstructive sleep apnea) Moderate sleep apnea with severe oxygen desaturations.  Reviewed risks of untreated sleep apnea and potential treatment options.  Shared decision to move forward with restarting CPAP.  Reviewed risks/benefits.  Educated on proper use/care of device.  Orders placed for new start CPAP 5-15 cmH2O, mask of choice and heated humidity.  With reassess response at follow-up.  Plan for ONO once well controlled on CPAP.  Reviewed safe driving practices.  Patient Instructions  Start to use CPAP every night, minimum of 4-6 hours a night.  Change equipment as directed. Wash your tubing with warm soap and water daily, hang to  dry. Wash humidifier portion weekly. Use bottled, distilled water and change daily Be aware of reduced alertness and do not drive or operate heavy machinery if experiencing this or drowsiness.  Exercise encouraged, as tolerated. Healthy weight management discussed.  Avoid or decrease alcohol consumption and medications that make you more sleepy, if possible. Notify if persistent daytime sleepiness occurs even with consistent use of PAP therapy.  Change CPAP supplies... Every month= Mask cushions and/or nasal pillows CPAP machine filters Every 3 months Mask frame (not including the headgear) CPAP tubing Every 6 months Mask headgear Chin strap (if applicable) Humidifier water tub  We discussed how untreated sleep apnea puts an individual at risk for cardiac arrhthymias, pulm HTN, DM, stroke and increases their risk for daytime accidents. We also briefly reviewed treatment options including weight loss, side sleeping position, oral appliance, CPAP therapy or referral to ENT for possible surgical  options  Follow up in 10-12 weeks with Florentina Addison Rhiley Tarver,NP, or sooner, if needed       I discussed the assessment and treatment plan with the patient. The patient was provided an opportunity to ask questions and all were answered. The patient agreed with the plan and demonstrated an understanding of the instructions.   The patient was advised to call back or seek an in-person evaluation if the symptoms worsen or if the condition fails to improve as anticipated.  I provided 31 minutes of non-face-to-face time during this encounter.   Noemi Chapel, NP

## 2023-10-29 NOTE — Assessment & Plan Note (Signed)
Moderate sleep apnea with severe oxygen desaturations.  Reviewed risks of untreated sleep apnea and potential treatment options.  Shared decision to move forward with restarting CPAP.  Reviewed risks/benefits.  Educated on proper use/care of device.  Orders placed for new start CPAP 5-15 cmH2O, mask of choice and heated humidity.  With reassess response at follow-up.  Plan for ONO once well controlled on CPAP.  Reviewed safe driving practices.  Patient Instructions  Start to use CPAP every night, minimum of 4-6 hours a night.  Change equipment as directed. Wash your tubing with warm soap and water daily, hang to dry. Wash humidifier portion weekly. Use bottled, distilled water and change daily Be aware of reduced alertness and do not drive or operate heavy machinery if experiencing this or drowsiness.  Exercise encouraged, as tolerated. Healthy weight management discussed.  Avoid or decrease alcohol consumption and medications that make you more sleepy, if possible. Notify if persistent daytime sleepiness occurs even with consistent use of PAP therapy.  Change CPAP supplies... Every month= Mask cushions and/or nasal pillows CPAP machine filters Every 3 months Mask frame (not including the headgear) CPAP tubing Every 6 months Mask headgear Chin strap (if applicable) Humidifier water tub  We discussed how untreated sleep apnea puts an individual at risk for cardiac arrhthymias, pulm HTN, DM, stroke and increases their risk for daytime accidents. We also briefly reviewed treatment options including weight loss, side sleeping position, oral appliance, CPAP therapy or referral to ENT for possible surgical options  Follow up in 10-12 weeks with Joseph Gracianna Vink,NP, or sooner, if needed

## 2023-10-29 NOTE — Patient Instructions (Signed)
Start to use CPAP every night, minimum of 4-6 hours a night.  Change equipment as directed. Wash your tubing with warm soap and water daily, hang to dry. Wash humidifier portion weekly. Use bottled, distilled water and change daily Be aware of reduced alertness and do not drive or operate heavy machinery if experiencing this or drowsiness.  Exercise encouraged, as tolerated. Healthy weight management discussed.  Avoid or decrease alcohol consumption and medications that make you more sleepy, if possible. Notify if persistent daytime sleepiness occurs even with consistent use of PAP therapy.  Change CPAP supplies... Every month= Mask cushions and/or nasal pillows CPAP machine filters Every 3 months Mask frame (not including the headgear) CPAP tubing Every 6 months Mask headgear Chin strap (if applicable) Humidifier water tub  We discussed how untreated sleep apnea puts an individual at risk for cardiac arrhthymias, pulm HTN, DM, stroke and increases their risk for daytime accidents. We also briefly reviewed treatment options including weight loss, side sleeping position, oral appliance, CPAP therapy or referral to ENT for possible surgical options  Follow up in 10-12 weeks with Katie Perian Tedder,NP, or sooner, if needed

## 2023-11-02 DIAGNOSIS — R809 Proteinuria, unspecified: Secondary | ICD-10-CM | POA: Diagnosis not present

## 2023-11-02 DIAGNOSIS — J40 Bronchitis, not specified as acute or chronic: Secondary | ICD-10-CM | POA: Diagnosis not present

## 2023-11-02 DIAGNOSIS — E1129 Type 2 diabetes mellitus with other diabetic kidney complication: Secondary | ICD-10-CM | POA: Diagnosis not present

## 2023-11-02 DIAGNOSIS — R030 Elevated blood-pressure reading, without diagnosis of hypertension: Secondary | ICD-10-CM | POA: Diagnosis not present

## 2023-11-02 DIAGNOSIS — E1165 Type 2 diabetes mellitus with hyperglycemia: Secondary | ICD-10-CM | POA: Diagnosis not present

## 2023-11-02 DIAGNOSIS — J329 Chronic sinusitis, unspecified: Secondary | ICD-10-CM | POA: Diagnosis not present

## 2023-11-02 DIAGNOSIS — K5090 Crohn's disease, unspecified, without complications: Secondary | ICD-10-CM | POA: Diagnosis not present

## 2023-12-10 ENCOUNTER — Other Ambulatory Visit: Payer: Self-pay

## 2023-12-10 ENCOUNTER — Ambulatory Visit: Payer: Medicare Other

## 2023-12-10 ENCOUNTER — Ambulatory Visit: Payer: Medicare Other | Admitting: Internal Medicine

## 2023-12-10 ENCOUNTER — Encounter: Payer: Self-pay | Admitting: Internal Medicine

## 2023-12-10 VITALS — BP 144/76 | HR 84 | Temp 98.4°F | Ht 67.72 in | Wt 185.0 lb

## 2023-12-10 DIAGNOSIS — Z91038 Other insect allergy status: Secondary | ICD-10-CM

## 2023-12-10 DIAGNOSIS — T63441D Toxic effect of venom of bees, accidental (unintentional), subsequent encounter: Secondary | ICD-10-CM

## 2023-12-10 MED ORDER — EPINEPHRINE 0.3 MG/0.3ML IJ SOAJ: 0.3000 mg | Freq: Every day | INTRAMUSCULAR | 1 refills | Status: AC | PRN

## 2023-12-10 NOTE — Progress Notes (Signed)
   FOLLOW UP Date of Service/Encounter:  12/10/23   Subjective:  Joseph Chase (DOB: 11/27/1955) is a 68 y.o. male who [facility] follow up for venom allergy.   History obtained from: chart review and patient. Last seen on 07/10/2022 by Dr Delorse Lek and doing well on VIT.  Discussed increasing interval to 12 weeks.   Reports doing well on VIT.  No stings since last visit.  Prior to that, had maybe 1 sting with a wasp with localized swelling, no systemic reaction.  Did not need Epi at the time.  He does report frequent exposure to stinging insects and at this time would like to continue VIT.  They have discussed stopping with him in the past but has decided in the past to stay on due to spending time outdoors.   Past Medical History: Past Medical History:  Diagnosis Date   Borderline diabetes    Diabetes mellitus without complication (HCC)    Elevated cholesterol    Hypertension    Ulcerative colitis (HCC)     Objective:  BP (!) 144/76   Pulse 84   Temp 98.4 F (36.9 C)   Ht 5' 7.72" (1.72 m)   Wt 185 lb (83.9 kg)   SpO2 95%   BMI 28.36 kg/m  Body mass index is 28.36 kg/m. Physical Exam: GEN: alert, well developed HEENT: clear conjunctiva, MMM HEART: regular rate and rhythm, no murmur LUNGS: clear to auscultation bilaterally, no coughing, unlabored respiration SKIN: no rashes or lesions   Assessment:   1. Hymenoptera allergy   2. Toxic effect of venom of bees, unintentional, subsequent encounter     Plan/Recommendations:  Stinging Insect Allergy.  - continue venom shots (wasps, mixed vespid) every 12 weeks.  If interested in stopping, would need to check tryptase bloodwork.  At this time, he wishes to stay on VIT indefinitely as he spends a lot of time outdoors remotely.  - please carry Epinephrine autoinjector at all times in case of accidental sting, to be used if stung and has SKIN AND ANY OTHER SYMPTOMS (throat  swelling, trouble breathing, wheezing, nausea, vomiting, lightheaded etc)  - for SKIN only such as hives or swelling, can take Benadryl 2 tsp (25 mg)  - practice avoidance measures when possible     Return in about 1 year (around 12/09/2024).  Alesia Morin, MD [facility]

## 2023-12-10 NOTE — Patient Instructions (Addendum)
Stinging Insect Allergy: - continue venom shots every 12 weeks.  - please carry Epinephrine autoinjector at all times in case of accidental sting, to be used if stung and has SKIN AND ANY OTHER SYMPTOMS (throat swelling, trouble breathing, wheezing, nausea, vomiting, lightheaded etc)  - for SKIN only such as hives or swelling, can take Benadryl 2 tsp (25 mg)  - practice avoidance measures when possible

## 2023-12-13 DIAGNOSIS — L820 Inflamed seborrheic keratosis: Secondary | ICD-10-CM | POA: Diagnosis not present

## 2023-12-13 DIAGNOSIS — Z08 Encounter for follow-up examination after completed treatment for malignant neoplasm: Secondary | ICD-10-CM | POA: Diagnosis not present

## 2023-12-13 DIAGNOSIS — L570 Actinic keratosis: Secondary | ICD-10-CM | POA: Diagnosis not present

## 2023-12-13 DIAGNOSIS — Z85828 Personal history of other malignant neoplasm of skin: Secondary | ICD-10-CM | POA: Diagnosis not present

## 2023-12-13 DIAGNOSIS — X32XXXD Exposure to sunlight, subsequent encounter: Secondary | ICD-10-CM | POA: Diagnosis not present

## 2023-12-19 DIAGNOSIS — M51362 Other intervertebral disc degeneration, lumbar region with discogenic back pain and lower extremity pain: Secondary | ICD-10-CM | POA: Diagnosis not present

## 2023-12-19 DIAGNOSIS — M48061 Spinal stenosis, lumbar region without neurogenic claudication: Secondary | ICD-10-CM | POA: Diagnosis not present

## 2023-12-20 DIAGNOSIS — I129 Hypertensive chronic kidney disease with stage 1 through stage 4 chronic kidney disease, or unspecified chronic kidney disease: Secondary | ICD-10-CM | POA: Diagnosis not present

## 2023-12-20 DIAGNOSIS — E1122 Type 2 diabetes mellitus with diabetic chronic kidney disease: Secondary | ICD-10-CM | POA: Diagnosis not present

## 2023-12-20 DIAGNOSIS — N182 Chronic kidney disease, stage 2 (mild): Secondary | ICD-10-CM | POA: Diagnosis not present

## 2023-12-20 DIAGNOSIS — R809 Proteinuria, unspecified: Secondary | ICD-10-CM | POA: Diagnosis not present

## 2023-12-24 DIAGNOSIS — R7989 Other specified abnormal findings of blood chemistry: Secondary | ICD-10-CM | POA: Diagnosis not present

## 2023-12-24 DIAGNOSIS — Z79899 Other long term (current) drug therapy: Secondary | ICD-10-CM | POA: Diagnosis not present

## 2023-12-24 DIAGNOSIS — K5100 Ulcerative (chronic) pancolitis without complications: Secondary | ICD-10-CM | POA: Diagnosis not present

## 2023-12-25 DIAGNOSIS — K5100 Ulcerative (chronic) pancolitis without complications: Secondary | ICD-10-CM | POA: Diagnosis not present

## 2023-12-25 DIAGNOSIS — R3915 Urgency of urination: Secondary | ICD-10-CM | POA: Diagnosis not present

## 2023-12-25 DIAGNOSIS — Z79899 Other long term (current) drug therapy: Secondary | ICD-10-CM | POA: Diagnosis not present

## 2023-12-25 DIAGNOSIS — R7989 Other specified abnormal findings of blood chemistry: Secondary | ICD-10-CM | POA: Diagnosis not present

## 2023-12-25 DIAGNOSIS — N200 Calculus of kidney: Secondary | ICD-10-CM | POA: Diagnosis not present

## 2024-01-07 ENCOUNTER — Other Ambulatory Visit (HOSPITAL_COMMUNITY): Payer: Self-pay | Admitting: Nephrology

## 2024-01-07 DIAGNOSIS — R809 Proteinuria, unspecified: Secondary | ICD-10-CM

## 2024-01-07 DIAGNOSIS — N182 Chronic kidney disease, stage 2 (mild): Secondary | ICD-10-CM

## 2024-01-10 DIAGNOSIS — M48061 Spinal stenosis, lumbar region without neurogenic claudication: Secondary | ICD-10-CM | POA: Diagnosis not present

## 2024-01-10 DIAGNOSIS — E119 Type 2 diabetes mellitus without complications: Secondary | ICD-10-CM | POA: Diagnosis not present

## 2024-01-14 DIAGNOSIS — M48061 Spinal stenosis, lumbar region without neurogenic claudication: Secondary | ICD-10-CM | POA: Diagnosis not present

## 2024-01-14 DIAGNOSIS — M51369 Other intervertebral disc degeneration, lumbar region without mention of lumbar back pain or lower extremity pain: Secondary | ICD-10-CM | POA: Diagnosis not present

## 2024-01-14 DIAGNOSIS — M47816 Spondylosis without myelopathy or radiculopathy, lumbar region: Secondary | ICD-10-CM | POA: Diagnosis not present

## 2024-01-15 ENCOUNTER — Telehealth: Payer: Self-pay | Admitting: Nurse Practitioner

## 2024-01-15 ENCOUNTER — Ambulatory Visit (HOSPITAL_COMMUNITY)
Admission: RE | Admit: 2024-01-15 | Discharge: 2024-01-15 | Disposition: A | Discharge status: Home / Self Care | Payer: Medicare Other | Source: Ambulatory Visit | Attending: Nephrology | Admitting: Nephrology

## 2024-01-15 DIAGNOSIS — R809 Proteinuria, unspecified: Secondary | ICD-10-CM | POA: Diagnosis not present

## 2024-01-15 DIAGNOSIS — N182 Chronic kidney disease, stage 2 (mild): Secondary | ICD-10-CM

## 2024-01-15 DIAGNOSIS — N183 Chronic kidney disease, stage 3 unspecified: Secondary | ICD-10-CM | POA: Diagnosis not present

## 2024-01-16 NOTE — Telephone Encounter (Signed)
 Faxed back 01/16/24 confirmation received

## 2024-01-17 DIAGNOSIS — M48061 Spinal stenosis, lumbar region without neurogenic claudication: Secondary | ICD-10-CM | POA: Diagnosis not present

## 2024-01-24 DIAGNOSIS — I10 Essential (primary) hypertension: Secondary | ICD-10-CM | POA: Diagnosis not present

## 2024-01-24 DIAGNOSIS — E1169 Type 2 diabetes mellitus with other specified complication: Secondary | ICD-10-CM | POA: Diagnosis not present

## 2024-01-24 DIAGNOSIS — K5090 Crohn's disease, unspecified, without complications: Secondary | ICD-10-CM | POA: Diagnosis not present

## 2024-01-24 DIAGNOSIS — E1165 Type 2 diabetes mellitus with hyperglycemia: Secondary | ICD-10-CM | POA: Diagnosis not present

## 2024-01-24 DIAGNOSIS — R5382 Chronic fatigue, unspecified: Secondary | ICD-10-CM | POA: Diagnosis not present

## 2024-01-31 DIAGNOSIS — G4733 Obstructive sleep apnea (adult) (pediatric): Secondary | ICD-10-CM | POA: Diagnosis not present

## 2024-01-31 DIAGNOSIS — E1129 Type 2 diabetes mellitus with other diabetic kidney complication: Secondary | ICD-10-CM | POA: Diagnosis not present

## 2024-01-31 DIAGNOSIS — K5190 Ulcerative colitis, unspecified, without complications: Secondary | ICD-10-CM | POA: Diagnosis not present

## 2024-01-31 DIAGNOSIS — I10 Essential (primary) hypertension: Secondary | ICD-10-CM | POA: Diagnosis not present

## 2024-02-04 DIAGNOSIS — M48061 Spinal stenosis, lumbar region without neurogenic claudication: Secondary | ICD-10-CM | POA: Diagnosis not present

## 2024-02-04 DIAGNOSIS — M4316 Spondylolisthesis, lumbar region: Secondary | ICD-10-CM | POA: Diagnosis not present

## 2024-02-04 DIAGNOSIS — M47816 Spondylosis without myelopathy or radiculopathy, lumbar region: Secondary | ICD-10-CM | POA: Diagnosis not present

## 2024-02-26 DIAGNOSIS — M51362 Other intervertebral disc degeneration, lumbar region with discogenic back pain and lower extremity pain: Secondary | ICD-10-CM | POA: Diagnosis not present

## 2024-02-26 DIAGNOSIS — Z789 Other specified health status: Secondary | ICD-10-CM | POA: Diagnosis not present

## 2024-02-26 DIAGNOSIS — R531 Weakness: Secondary | ICD-10-CM | POA: Diagnosis not present

## 2024-02-26 DIAGNOSIS — R2689 Other abnormalities of gait and mobility: Secondary | ICD-10-CM | POA: Diagnosis not present

## 2024-02-26 DIAGNOSIS — M48061 Spinal stenosis, lumbar region without neurogenic claudication: Secondary | ICD-10-CM | POA: Diagnosis not present

## 2024-02-26 DIAGNOSIS — M4726 Other spondylosis with radiculopathy, lumbar region: Secondary | ICD-10-CM | POA: Diagnosis not present

## 2024-02-28 DIAGNOSIS — G4733 Obstructive sleep apnea (adult) (pediatric): Secondary | ICD-10-CM | POA: Diagnosis not present

## 2024-02-28 DIAGNOSIS — K5190 Ulcerative colitis, unspecified, without complications: Secondary | ICD-10-CM | POA: Diagnosis not present

## 2024-02-28 DIAGNOSIS — E1129 Type 2 diabetes mellitus with other diabetic kidney complication: Secondary | ICD-10-CM | POA: Diagnosis not present

## 2024-02-28 DIAGNOSIS — I10 Essential (primary) hypertension: Secondary | ICD-10-CM | POA: Diagnosis not present

## 2024-02-29 DIAGNOSIS — M4316 Spondylolisthesis, lumbar region: Secondary | ICD-10-CM | POA: Diagnosis not present

## 2024-02-29 DIAGNOSIS — M47816 Spondylosis without myelopathy or radiculopathy, lumbar region: Secondary | ICD-10-CM | POA: Diagnosis not present

## 2024-02-29 DIAGNOSIS — M7918 Myalgia, other site: Secondary | ICD-10-CM | POA: Diagnosis not present

## 2024-03-03 ENCOUNTER — Ambulatory Visit (INDEPENDENT_AMBULATORY_CARE_PROVIDER_SITE_OTHER): Payer: Medicare Other

## 2024-03-03 DIAGNOSIS — Z91038 Other insect allergy status: Secondary | ICD-10-CM | POA: Diagnosis not present

## 2024-03-05 DIAGNOSIS — R2689 Other abnormalities of gait and mobility: Secondary | ICD-10-CM | POA: Diagnosis not present

## 2024-03-05 DIAGNOSIS — Z789 Other specified health status: Secondary | ICD-10-CM | POA: Diagnosis not present

## 2024-03-05 DIAGNOSIS — R531 Weakness: Secondary | ICD-10-CM | POA: Diagnosis not present

## 2024-03-05 DIAGNOSIS — M51362 Other intervertebral disc degeneration, lumbar region with discogenic back pain and lower extremity pain: Secondary | ICD-10-CM | POA: Diagnosis not present

## 2024-03-05 DIAGNOSIS — M48061 Spinal stenosis, lumbar region without neurogenic claudication: Secondary | ICD-10-CM | POA: Diagnosis not present

## 2024-03-05 DIAGNOSIS — M4726 Other spondylosis with radiculopathy, lumbar region: Secondary | ICD-10-CM | POA: Diagnosis not present

## 2024-03-06 ENCOUNTER — Other Ambulatory Visit: Payer: Self-pay | Admitting: Internal Medicine

## 2024-03-07 DIAGNOSIS — R2689 Other abnormalities of gait and mobility: Secondary | ICD-10-CM | POA: Diagnosis not present

## 2024-03-07 DIAGNOSIS — M51362 Other intervertebral disc degeneration, lumbar region with discogenic back pain and lower extremity pain: Secondary | ICD-10-CM | POA: Diagnosis not present

## 2024-03-07 DIAGNOSIS — M4726 Other spondylosis with radiculopathy, lumbar region: Secondary | ICD-10-CM | POA: Diagnosis not present

## 2024-03-07 DIAGNOSIS — R531 Weakness: Secondary | ICD-10-CM | POA: Diagnosis not present

## 2024-03-07 DIAGNOSIS — Z789 Other specified health status: Secondary | ICD-10-CM | POA: Diagnosis not present

## 2024-03-07 DIAGNOSIS — M48061 Spinal stenosis, lumbar region without neurogenic claudication: Secondary | ICD-10-CM | POA: Diagnosis not present

## 2024-03-17 DIAGNOSIS — R531 Weakness: Secondary | ICD-10-CM | POA: Diagnosis not present

## 2024-03-17 DIAGNOSIS — R2689 Other abnormalities of gait and mobility: Secondary | ICD-10-CM | POA: Diagnosis not present

## 2024-03-17 DIAGNOSIS — M4726 Other spondylosis with radiculopathy, lumbar region: Secondary | ICD-10-CM | POA: Diagnosis not present

## 2024-03-17 DIAGNOSIS — Z789 Other specified health status: Secondary | ICD-10-CM | POA: Diagnosis not present

## 2024-03-17 DIAGNOSIS — M51362 Other intervertebral disc degeneration, lumbar region with discogenic back pain and lower extremity pain: Secondary | ICD-10-CM | POA: Diagnosis not present

## 2024-03-17 DIAGNOSIS — M48061 Spinal stenosis, lumbar region without neurogenic claudication: Secondary | ICD-10-CM | POA: Diagnosis not present

## 2024-03-18 DIAGNOSIS — E1122 Type 2 diabetes mellitus with diabetic chronic kidney disease: Secondary | ICD-10-CM | POA: Diagnosis not present

## 2024-03-18 DIAGNOSIS — K5100 Ulcerative (chronic) pancolitis without complications: Secondary | ICD-10-CM | POA: Diagnosis not present

## 2024-03-18 DIAGNOSIS — R809 Proteinuria, unspecified: Secondary | ICD-10-CM | POA: Diagnosis not present

## 2024-03-18 DIAGNOSIS — N189 Chronic kidney disease, unspecified: Secondary | ICD-10-CM | POA: Diagnosis not present

## 2024-03-18 DIAGNOSIS — I129 Hypertensive chronic kidney disease with stage 1 through stage 4 chronic kidney disease, or unspecified chronic kidney disease: Secondary | ICD-10-CM | POA: Diagnosis not present

## 2024-03-21 DIAGNOSIS — M51362 Other intervertebral disc degeneration, lumbar region with discogenic back pain and lower extremity pain: Secondary | ICD-10-CM | POA: Diagnosis not present

## 2024-03-21 DIAGNOSIS — R2689 Other abnormalities of gait and mobility: Secondary | ICD-10-CM | POA: Diagnosis not present

## 2024-03-21 DIAGNOSIS — Z789 Other specified health status: Secondary | ICD-10-CM | POA: Diagnosis not present

## 2024-03-21 DIAGNOSIS — R531 Weakness: Secondary | ICD-10-CM | POA: Diagnosis not present

## 2024-03-21 DIAGNOSIS — M48061 Spinal stenosis, lumbar region without neurogenic claudication: Secondary | ICD-10-CM | POA: Diagnosis not present

## 2024-03-21 DIAGNOSIS — M4726 Other spondylosis with radiculopathy, lumbar region: Secondary | ICD-10-CM | POA: Diagnosis not present

## 2024-03-25 DIAGNOSIS — I129 Hypertensive chronic kidney disease with stage 1 through stage 4 chronic kidney disease, or unspecified chronic kidney disease: Secondary | ICD-10-CM | POA: Diagnosis not present

## 2024-03-25 DIAGNOSIS — R809 Proteinuria, unspecified: Secondary | ICD-10-CM | POA: Diagnosis not present

## 2024-03-25 DIAGNOSIS — E1122 Type 2 diabetes mellitus with diabetic chronic kidney disease: Secondary | ICD-10-CM | POA: Diagnosis not present

## 2024-03-25 DIAGNOSIS — E1129 Type 2 diabetes mellitus with other diabetic kidney complication: Secondary | ICD-10-CM | POA: Diagnosis not present

## 2024-03-25 DIAGNOSIS — K5100 Ulcerative (chronic) pancolitis without complications: Secondary | ICD-10-CM | POA: Diagnosis not present

## 2024-03-27 DIAGNOSIS — R531 Weakness: Secondary | ICD-10-CM | POA: Diagnosis not present

## 2024-03-27 DIAGNOSIS — M4726 Other spondylosis with radiculopathy, lumbar region: Secondary | ICD-10-CM | POA: Diagnosis not present

## 2024-03-27 DIAGNOSIS — M51362 Other intervertebral disc degeneration, lumbar region with discogenic back pain and lower extremity pain: Secondary | ICD-10-CM | POA: Diagnosis not present

## 2024-03-27 DIAGNOSIS — M48061 Spinal stenosis, lumbar region without neurogenic claudication: Secondary | ICD-10-CM | POA: Diagnosis not present

## 2024-03-27 DIAGNOSIS — Z789 Other specified health status: Secondary | ICD-10-CM | POA: Diagnosis not present

## 2024-03-27 DIAGNOSIS — R2689 Other abnormalities of gait and mobility: Secondary | ICD-10-CM | POA: Diagnosis not present

## 2024-03-31 DIAGNOSIS — E1129 Type 2 diabetes mellitus with other diabetic kidney complication: Secondary | ICD-10-CM | POA: Diagnosis not present

## 2024-03-31 DIAGNOSIS — I10 Essential (primary) hypertension: Secondary | ICD-10-CM | POA: Diagnosis not present

## 2024-03-31 DIAGNOSIS — G4733 Obstructive sleep apnea (adult) (pediatric): Secondary | ICD-10-CM | POA: Diagnosis not present

## 2024-03-31 DIAGNOSIS — K5190 Ulcerative colitis, unspecified, without complications: Secondary | ICD-10-CM | POA: Diagnosis not present

## 2024-04-04 DIAGNOSIS — M48061 Spinal stenosis, lumbar region without neurogenic claudication: Secondary | ICD-10-CM | POA: Diagnosis not present

## 2024-04-04 DIAGNOSIS — M51362 Other intervertebral disc degeneration, lumbar region with discogenic back pain and lower extremity pain: Secondary | ICD-10-CM | POA: Diagnosis not present

## 2024-04-04 DIAGNOSIS — R531 Weakness: Secondary | ICD-10-CM | POA: Diagnosis not present

## 2024-04-04 DIAGNOSIS — R2689 Other abnormalities of gait and mobility: Secondary | ICD-10-CM | POA: Diagnosis not present

## 2024-04-04 DIAGNOSIS — M4726 Other spondylosis with radiculopathy, lumbar region: Secondary | ICD-10-CM | POA: Diagnosis not present

## 2024-04-04 DIAGNOSIS — Z789 Other specified health status: Secondary | ICD-10-CM | POA: Diagnosis not present

## 2024-04-08 DIAGNOSIS — M51362 Other intervertebral disc degeneration, lumbar region with discogenic back pain and lower extremity pain: Secondary | ICD-10-CM | POA: Diagnosis not present

## 2024-04-08 DIAGNOSIS — M48061 Spinal stenosis, lumbar region without neurogenic claudication: Secondary | ICD-10-CM | POA: Diagnosis not present

## 2024-04-08 DIAGNOSIS — Z789 Other specified health status: Secondary | ICD-10-CM | POA: Diagnosis not present

## 2024-04-08 DIAGNOSIS — R2689 Other abnormalities of gait and mobility: Secondary | ICD-10-CM | POA: Diagnosis not present

## 2024-04-08 DIAGNOSIS — M4726 Other spondylosis with radiculopathy, lumbar region: Secondary | ICD-10-CM | POA: Diagnosis not present

## 2024-04-08 DIAGNOSIS — R531 Weakness: Secondary | ICD-10-CM | POA: Diagnosis not present

## 2024-04-10 DIAGNOSIS — Z789 Other specified health status: Secondary | ICD-10-CM | POA: Diagnosis not present

## 2024-04-10 DIAGNOSIS — M4726 Other spondylosis with radiculopathy, lumbar region: Secondary | ICD-10-CM | POA: Diagnosis not present

## 2024-04-10 DIAGNOSIS — R531 Weakness: Secondary | ICD-10-CM | POA: Diagnosis not present

## 2024-04-10 DIAGNOSIS — R2689 Other abnormalities of gait and mobility: Secondary | ICD-10-CM | POA: Diagnosis not present

## 2024-04-10 DIAGNOSIS — M48061 Spinal stenosis, lumbar region without neurogenic claudication: Secondary | ICD-10-CM | POA: Diagnosis not present

## 2024-04-10 DIAGNOSIS — M51362 Other intervertebral disc degeneration, lumbar region with discogenic back pain and lower extremity pain: Secondary | ICD-10-CM | POA: Diagnosis not present

## 2024-04-16 DIAGNOSIS — M51362 Other intervertebral disc degeneration, lumbar region with discogenic back pain and lower extremity pain: Secondary | ICD-10-CM | POA: Diagnosis not present

## 2024-04-16 DIAGNOSIS — G4733 Obstructive sleep apnea (adult) (pediatric): Secondary | ICD-10-CM | POA: Diagnosis not present

## 2024-04-16 DIAGNOSIS — I10 Essential (primary) hypertension: Secondary | ICD-10-CM | POA: Diagnosis not present

## 2024-04-16 DIAGNOSIS — M48061 Spinal stenosis, lumbar region without neurogenic claudication: Secondary | ICD-10-CM | POA: Diagnosis not present

## 2024-04-16 DIAGNOSIS — M4726 Other spondylosis with radiculopathy, lumbar region: Secondary | ICD-10-CM | POA: Diagnosis not present

## 2024-04-16 DIAGNOSIS — R2689 Other abnormalities of gait and mobility: Secondary | ICD-10-CM | POA: Diagnosis not present

## 2024-04-16 DIAGNOSIS — E1129 Type 2 diabetes mellitus with other diabetic kidney complication: Secondary | ICD-10-CM | POA: Diagnosis not present

## 2024-04-16 DIAGNOSIS — K5190 Ulcerative colitis, unspecified, without complications: Secondary | ICD-10-CM | POA: Diagnosis not present

## 2024-04-16 DIAGNOSIS — Z789 Other specified health status: Secondary | ICD-10-CM | POA: Diagnosis not present

## 2024-04-16 DIAGNOSIS — R531 Weakness: Secondary | ICD-10-CM | POA: Diagnosis not present

## 2024-04-17 ENCOUNTER — Telehealth: Payer: Self-pay

## 2024-04-17 DIAGNOSIS — R531 Weakness: Secondary | ICD-10-CM | POA: Diagnosis not present

## 2024-04-17 DIAGNOSIS — R2689 Other abnormalities of gait and mobility: Secondary | ICD-10-CM | POA: Diagnosis not present

## 2024-04-17 DIAGNOSIS — Z789 Other specified health status: Secondary | ICD-10-CM | POA: Diagnosis not present

## 2024-04-17 DIAGNOSIS — M4726 Other spondylosis with radiculopathy, lumbar region: Secondary | ICD-10-CM | POA: Diagnosis not present

## 2024-04-17 DIAGNOSIS — M48061 Spinal stenosis, lumbar region without neurogenic claudication: Secondary | ICD-10-CM | POA: Diagnosis not present

## 2024-04-17 DIAGNOSIS — M51362 Other intervertebral disc degeneration, lumbar region with discogenic back pain and lower extremity pain: Secondary | ICD-10-CM | POA: Diagnosis not present

## 2024-04-17 NOTE — Telephone Encounter (Signed)
 Copied from CRM [id number]. Topic: Appointments - Appointment Scheduling >> Apr 15, 2024  1:01 PM Eveleen Hinds B wrote: Patient calling to schedule follow up appt with K Cobb. Unable to schedule due to schedule not available.   Patient  states he needs to talk to someone as he has question about his equipment. Please call patient at [phone].   Patient is scheduled,NFN

## 2024-04-22 DIAGNOSIS — Z789 Other specified health status: Secondary | ICD-10-CM | POA: Diagnosis not present

## 2024-04-22 DIAGNOSIS — R2689 Other abnormalities of gait and mobility: Secondary | ICD-10-CM | POA: Diagnosis not present

## 2024-04-22 DIAGNOSIS — M48061 Spinal stenosis, lumbar region without neurogenic claudication: Secondary | ICD-10-CM | POA: Diagnosis not present

## 2024-04-22 DIAGNOSIS — M51362 Other intervertebral disc degeneration, lumbar region with discogenic back pain and lower extremity pain: Secondary | ICD-10-CM | POA: Diagnosis not present

## 2024-04-22 DIAGNOSIS — M4726 Other spondylosis with radiculopathy, lumbar region: Secondary | ICD-10-CM | POA: Diagnosis not present

## 2024-04-22 DIAGNOSIS — R531 Weakness: Secondary | ICD-10-CM | POA: Diagnosis not present

## 2024-04-24 ENCOUNTER — Ambulatory Visit: Admitting: Nurse Practitioner

## 2024-04-24 ENCOUNTER — Encounter: Payer: Self-pay | Admitting: Nurse Practitioner

## 2024-04-24 VITALS — BP 128/77 | HR 65 | Ht 67.0 in | Wt 180.6 lb

## 2024-04-24 DIAGNOSIS — M4726 Other spondylosis with radiculopathy, lumbar region: Secondary | ICD-10-CM | POA: Diagnosis not present

## 2024-04-24 DIAGNOSIS — G4733 Obstructive sleep apnea (adult) (pediatric): Secondary | ICD-10-CM

## 2024-04-24 DIAGNOSIS — M51362 Other intervertebral disc degeneration, lumbar region with discogenic back pain and lower extremity pain: Secondary | ICD-10-CM | POA: Diagnosis not present

## 2024-04-24 DIAGNOSIS — Z789 Other specified health status: Secondary | ICD-10-CM | POA: Diagnosis not present

## 2024-04-24 DIAGNOSIS — R2689 Other abnormalities of gait and mobility: Secondary | ICD-10-CM | POA: Diagnosis not present

## 2024-04-24 DIAGNOSIS — R531 Weakness: Secondary | ICD-10-CM | POA: Diagnosis not present

## 2024-04-24 DIAGNOSIS — M48061 Spinal stenosis, lumbar region without neurogenic claudication: Secondary | ICD-10-CM | POA: Diagnosis not present

## 2024-04-24 NOTE — Patient Instructions (Addendum)
 Continue to use CPAP every night, minimum of 4-6 hours a night.  Change equipment as directed. Wash your tubing with warm soap and water daily, hang to dry. Wash humidifier portion weekly. Use bottled, distilled water and change daily Be aware of reduced alertness and do not drive or operate heavy machinery if experiencing this or drowsiness.  Exercise encouraged, as tolerated. Healthy weight management discussed.  Avoid or decrease alcohol consumption and medications that make you more sleepy, if possible. Notify if persistent daytime sleepiness occurs even with consistent use of PAP therapy.  Call [employer] to order supplies [phone] Or you can look on their website; just make sure this runs it through insurance  [email or web address]  Follow up in one year with Joseph Xzaiver Vayda,NP. If symptoms worsen, please contact office for sooner follow up

## 2024-04-24 NOTE — Assessment & Plan Note (Signed)
 Moderate OSA on CPAP.  Excellent control and compliance.  Significant improvement in symptoms and receiving benefit from use.  Aware of proper care/use of device.  Provided with contact information for DME company to refill supplies.  Understands risks of untreated sleep apnea.  Safe driving practices reviewed.  Patient Instructions  Continue to use CPAP every night, minimum of 4-6 hours a night.  Change equipment as directed. Wash your tubing with warm soap and water daily, hang to dry. Wash humidifier portion weekly. Use bottled, distilled water and change daily Be aware of reduced alertness and do not drive or operate heavy machinery if experiencing this or drowsiness.  Exercise encouraged, as tolerated. Healthy weight management discussed.  Avoid or decrease alcohol consumption and medications that make you more sleepy, if possible. Notify if persistent daytime sleepiness occurs even with consistent use of PAP therapy.  Call [employer] to order supplies [phone] Or you can look on their website; just make sure this runs it through insurance  [email or web address]  Follow up in one year with Joseph Jayleah Garbers,NP. If symptoms worsen, please contact office for sooner follow up

## 2024-04-24 NOTE — Progress Notes (Signed)
 @Patient  ID: Joseph Chase, male    DOB: November 04, 1956, 68 y.o.   MRN: [id number]  Chief Complaint  Patient presents with   Follow-up    Referring provider: Lauran Pollard, MD  HPI: 68 year old male, never smoker followed for sleep apnea. Past medical history significant for HTN, insomnia.    TEST/EVENTS:  09/05/2023 HST: AHI 15.7/h, SpO2 low 63%    12/14/2022: OV with Jassica Zazueta NP for sleep consult. Diagnosed with OSA years ago and was on CPAP; however, his wife became ill around 5 years ago and then after caring for her, he stopped wearing his machine about 2 years ago. She unfortunately passed around the same time. Since then, he doesn't necessarily feel like his daytime sleepiness and sleep are much different. He's struggled with his sleep for a long time. He has been on trazodone and ambien . He has trouble falling and staying asleep. Despite CPAP therapy, he still had restless sleep and wakes up feeling poorly rested. He did have a lot of life stressors at the time. He was told previously that he snored. Never told he stopped breathing. He's unable to nap or fall asleep during the day, regardless of how tired he is. He denies current trouble with morning dry mouth, headaches, sleep parasomnias/paralysis. No history of narcolepsy or cataplexy.  He goes to bed around 11-11:30 pm. With ambien , falls asleep within 15 min. This tends to only keep him asleep for around 4-5 hours. Without it, he will wake up 5-6 times a night. Gets out of bed around 7:30 am. His previous sleep study was at a [facility] facility in [place]. He does not remember the office or the provider. He thinks he had either mild or moderate OSA. He is down 10 lb over the past two years. He has a history of HTN and DM. No history of stroke. Works in Systems [employer]. 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 He is a never smoker. Drinks 6-10 beers a week. Lives by himself. Family history of heart disease.  Epworth 0   10/29/2023: OV with  Tianne Plott NP for follow-up to discuss home sleep study results which revealed moderate sleep apnea with severe oxygen desaturations.  He is still having difficulties with his sleep at night.  Feels like it is always restless.  Wakes feeling poorly rested.  Has tried multiple sleep aids.  Still tends to wake up throughout the night.  Denies any drowsy driving, sleep parasomnia/paralysis, morning headaches.  04/24/2024: Today - follow up Patient presents today for follow-up.  He was started on CPAP after last visit.  He is doing well with this.  Does need to order new supplies.  He was not sure who needs to call.  Otherwise, he is sleeping better.  Feels like his energy levels are better.  He is also gone from 4 to 5-hour stretches and is now averaging about 7 to 8 hours a night.  No issues with drowsy driving or morning headaches.  03/25/2024-04/23/2024: CPAP 5-16 cmH2O 29/30 days; 93% >4 hr; average use 7 hours 57 minutes Pressure 95th 10.8 Leaks 95th 8.9 AHI 3.5  Allergies  Allergen Reactions   Venomil Mixed Vespid [Mixed Vespid Venom]    Venomil Wasp Venom [Wasp Venom]    Penicillins Rash    Immunization History  Administered Date(s) Administered   Influenza,inj,quad, With Preservative 09/07/2017   Moderna Sars-Covid-2 Vaccination 12/17/2019, 01/14/2020   PPD Test 02/27/2014   Pneumococcal Conjugate-13 02/26/2017   Pneumococcal Polysaccharide-23 04/19/2021  Td (Adult),5 Lf Tetanus Toxid, Preservative Free 10/07/2005   Zoster Recombinant(Shingrix) 12/05/2022, 01/30/2023    Past Medical History:  Diagnosis Date   Borderline diabetes    Diabetes mellitus without complication (HCC)    Elevated cholesterol    Hypertension    Ulcerative colitis (HCC)     Tobacco History: Social History   Tobacco Use  Smoking Status Never  Smokeless Tobacco Never   Counseling given: Not Answered   Outpatient Medications Prior to Visit  Medication Sig Dispense Refill   amLODipine  (NORVASC ) 10 MG  tablet take 1 tablet (10 MILLIGRAM total) by mouth daily. 90 tablet 1   chlorthalidone  (HYGROTON ) 25 MG tablet take 1 tablet (25 MILLIGRAM total) by mouth daily. 90 tablet 2   Coenzyme Q10 (CO Q 10) 100 MG CAPS Take 1 capsule by mouth daily.     EPINEPHrine  (EPIPEN  2-PAK) 0.3 mg/0.3 mL IJ SOAJ injection Inject 0.3 mg into the muscle daily as needed. For severe life-threatening allergic reaction 2 each 1   fexofenadine (ALLEGRA) 180 MG tablet Take 180 mg by mouth daily.     gabapentin  (NEURONTIN ) 100 MG capsule Take 200 mg by mouth at bedtime.     glipiZIDE (GLUCOTROL) 5 MG tablet Take 5 mg by mouth 2 (two) times daily.     losartan  (COZAAR ) 100 MG tablet Take 100 mg by mouth daily.     metFORMIN (GLUCOPHAGE-XR) 500 MG 24 hr tablet Take 500 mg by mouth 2 (two) times daily with a meal.     metoprolol  succinate (TOPROL -XL) 25 MG 24 hr tablet Take 75 mg by mouth daily.     Multiple Vitamin (THERA) TABS Take 1 tablet by mouth daily.     rosuvastatin  (CRESTOR ) 10 MG tablet Take 10 mg by mouth daily.     zolpidem  (AMBIEN ) 10 MG tablet Take 10 mg by mouth at bedtime.     acetaminophen  (TYLENOL ) 500 MG tablet Take 2 tablets (1,000 mg total) by mouth every 6 (six) hours as needed for mild pain. 30 tablet 0   azaTHIOprine  (IMURAN ) 50 MG tablet Take 200 mg by mouth daily.     sildenafil  (REVATIO ) 20 MG tablet Take 3-5 tablets 30 minutes before intercourse. 30 tablet 6   No facility-administered medications prior to visit.     Review of Systems:   Constitutional: No weight loss or gain, night sweats, fevers, chills, or lassitude. +fatigue (improved) HEENT: No headaches, difficulty swallowing, tooth/dental problems, or sore throat. No sneezing, itching, ear ache, nasal congestion, or post nasal drip CV:  No chest pain, orthopnea, PND, swelling in lower extremities, anasarca, dizziness, palpitations, syncope Resp: +snoring (resolves with CPAP). No shortness of breath with exertion or at rest. No  cough GI:  No heartburn, indigestion GU: No nocturia  MSK:  No joint pain or swelling.   Neuro: No dizziness or lightheadedness.  Psych: No depression or anxiety. Mood stable. +sleep disturbance (improved with CPAP)    Physical Exam:  BP 128/77 (BP Location: Left Arm)   Pulse 65   Ht 5' 7 (1.702 m)   Wt 180 lb 9.6 oz (81.9 kg)   SpO2 97% Comment: RA  BMI 28.29 kg/m   GEN: Pleasant, interactive, well-appearing; in no acute distress. HEENT:  Normocephalic and atraumatic. PERRLA. Sclera white. Nasal turbinates pink, moist and patent bilaterally. No rhinorrhea present. Oropharynx pink and moist, without exudate or edema. No lesions, ulcerations, or postnasal drip.  NECK:  Supple w/ fair ROM. No JVD present. Normal carotid impulses w/o  bruits. Thyroid  symmetrical with no goiter or nodules palpated. No lymphadenopathy.   CV: RRR, no m/r/g, no peripheral edema. Pulses intact, +2 bilaterally. No cyanosis, pallor or clubbing. PULMONARY:  Unlabored, regular breathing. Clear bilaterally A&P w/o wheezes/rales/rhonchi. No accessory muscle use.  GI: BS present and normoactive. Soft, non-tender to palpation. No organomegaly or masses detected.  MSK: No erythema, warmth or tenderness. Cap refil <2 sec all extrem. No deformities or joint swelling noted.  Neuro: A/Ox3. No focal deficits noted.   Skin: Warm, no lesions or rashe Psych: Normal affect and behavior. Judgement and thought content appropriate.     Lab Results:  CBC    Component Value Date/Time   WBC 9.3 10/11/2022 2220   RBC 4.84 10/11/2022 2220   HGB 15.1 10/11/2022 2220   HCT 43.2 10/11/2022 2220   PLT 186 10/11/2022 2220   MCV 89.3 10/11/2022 2220   MCH 31.2 10/11/2022 2220   MCHC 35.0 10/11/2022 2220   RDW 13.0 10/11/2022 2220   LYMPHSABS 1.6 10/11/2022 2220   MONOABS 0.8 10/11/2022 2220   EOSABS 0.2 10/11/2022 2220   BASOSABS 0.1 10/11/2022 2220    BMET    Component Value Date/Time   NA 138 04/24/2023 1542   K  3.9 04/24/2023 1542   CL 97 04/24/2023 1542   CO2 28 04/24/2023 1542   GLUCOSE 164 (H) 04/24/2023 1542   GLUCOSE 105 (H) 10/11/2022 2220   BUN 20 04/24/2023 1542   CREATININE 1.22 04/24/2023 1542   CALCIUM  10.0 04/24/2023 1542   GFRNONAA >60 10/11/2022 2220   GFRAA (L) 03/23/2011 1030    49        The eGFR has been calculated using the MDRD equation. This calculation has not been validated in all clinical situations. eGFR's persistently <60 mL/min signify possible Chronic Kidney Disease.    BNP No results found for: BNP   Imaging:  No results found.  Administration History     None           No data to display          No results found for: NITRICOXIDE      Assessment & Plan:   OSA (obstructive sleep apnea) Moderate OSA on CPAP.  Excellent control and compliance.  Significant improvement in symptoms and receiving benefit from use.  Aware of proper care/use of device.  Provided with contact information for DME company to refill supplies.  Understands risks of untreated sleep apnea.  Safe driving practices reviewed.  Patient Instructions  Continue to use CPAP every night, minimum of 4-6 hours a night.  Change equipment as directed. Wash your tubing with warm soap and water daily, hang to dry. Wash humidifier portion weekly. Use bottled, distilled water and change daily Be aware of reduced alertness and do not drive or operate heavy machinery if experiencing this or drowsiness.  Exercise encouraged, as tolerated. Healthy weight management discussed.  Avoid or decrease alcohol consumption and medications that make you more sleepy, if possible. Notify if persistent daytime sleepiness occurs even with consistent use of PAP therapy.  Call [employer] to order supplies [phone] Or you can look on their website; just make sure this runs it through insurance  [email or web address]  Follow up in one year with Katie Ceyda Peterka,NP. If  symptoms worsen, please contact office for sooner follow up    I spent 25 minutes of dedicated to the care of this patient on the date of this encounter to include pre-visit review of records, face-to-face time with  the patient discussing conditions above, post visit ordering of testing, clinical documentation with the electronic health record, making appropriate referrals as documented, and communicating necessary findings to members of the patients care team.  Roetta Clarke, NP 04/24/2024  Pt aware and understands NP's role.

## 2024-04-29 DIAGNOSIS — E1129 Type 2 diabetes mellitus with other diabetic kidney complication: Secondary | ICD-10-CM | POA: Diagnosis not present

## 2024-04-29 DIAGNOSIS — G4733 Obstructive sleep apnea (adult) (pediatric): Secondary | ICD-10-CM | POA: Diagnosis not present

## 2024-04-29 DIAGNOSIS — K5190 Ulcerative colitis, unspecified, without complications: Secondary | ICD-10-CM | POA: Diagnosis not present

## 2024-04-29 DIAGNOSIS — I10 Essential (primary) hypertension: Secondary | ICD-10-CM | POA: Diagnosis not present

## 2024-04-30 DIAGNOSIS — M48061 Spinal stenosis, lumbar region without neurogenic claudication: Secondary | ICD-10-CM | POA: Diagnosis not present

## 2024-04-30 DIAGNOSIS — M51369 Other intervertebral disc degeneration, lumbar region without mention of lumbar back pain or lower extremity pain: Secondary | ICD-10-CM | POA: Diagnosis not present

## 2024-04-30 DIAGNOSIS — M47816 Spondylosis without myelopathy or radiculopathy, lumbar region: Secondary | ICD-10-CM | POA: Diagnosis not present

## 2024-05-26 ENCOUNTER — Ambulatory Visit (INDEPENDENT_AMBULATORY_CARE_PROVIDER_SITE_OTHER)

## 2024-05-26 DIAGNOSIS — Z91038 Other insect allergy status: Secondary | ICD-10-CM

## 2024-06-09 DIAGNOSIS — K5190 Ulcerative colitis, unspecified, without complications: Secondary | ICD-10-CM | POA: Diagnosis not present

## 2024-06-09 DIAGNOSIS — K621 Rectal polyp: Secondary | ICD-10-CM | POA: Diagnosis not present

## 2024-06-09 DIAGNOSIS — K635 Polyp of colon: Secondary | ICD-10-CM | POA: Diagnosis not present

## 2024-06-09 DIAGNOSIS — K6389 Other specified diseases of intestine: Secondary | ICD-10-CM | POA: Diagnosis not present

## 2024-06-09 DIAGNOSIS — I10 Essential (primary) hypertension: Secondary | ICD-10-CM | POA: Diagnosis not present

## 2024-06-09 DIAGNOSIS — D123 Benign neoplasm of transverse colon: Secondary | ICD-10-CM | POA: Diagnosis not present

## 2024-06-09 DIAGNOSIS — D122 Benign neoplasm of ascending colon: Secondary | ICD-10-CM | POA: Diagnosis not present

## 2024-06-09 DIAGNOSIS — Z1211 Encounter for screening for malignant neoplasm of colon: Secondary | ICD-10-CM | POA: Diagnosis not present

## 2024-06-11 DIAGNOSIS — Z5181 Encounter for therapeutic drug level monitoring: Secondary | ICD-10-CM | POA: Diagnosis not present

## 2024-06-11 DIAGNOSIS — E1165 Type 2 diabetes mellitus with hyperglycemia: Secondary | ICD-10-CM | POA: Diagnosis not present

## 2024-06-11 DIAGNOSIS — E1129 Type 2 diabetes mellitus with other diabetic kidney complication: Secondary | ICD-10-CM | POA: Diagnosis not present

## 2024-06-11 DIAGNOSIS — I10 Essential (primary) hypertension: Secondary | ICD-10-CM | POA: Diagnosis not present

## 2024-06-11 DIAGNOSIS — R5382 Chronic fatigue, unspecified: Secondary | ICD-10-CM | POA: Diagnosis not present

## 2024-06-11 LAB — LAB REPORT - SCANNED
A1c: 7.8
EGFR: 60.3

## 2024-06-16 DIAGNOSIS — M7918 Myalgia, other site: Secondary | ICD-10-CM | POA: Diagnosis not present

## 2024-06-16 DIAGNOSIS — M47816 Spondylosis without myelopathy or radiculopathy, lumbar region: Secondary | ICD-10-CM | POA: Diagnosis not present

## 2024-06-16 DIAGNOSIS — M48061 Spinal stenosis, lumbar region without neurogenic claudication: Secondary | ICD-10-CM | POA: Diagnosis not present

## 2024-06-18 DIAGNOSIS — K5190 Ulcerative colitis, unspecified, without complications: Secondary | ICD-10-CM | POA: Diagnosis not present

## 2024-06-18 DIAGNOSIS — I10 Essential (primary) hypertension: Secondary | ICD-10-CM | POA: Diagnosis not present

## 2024-06-18 DIAGNOSIS — E1129 Type 2 diabetes mellitus with other diabetic kidney complication: Secondary | ICD-10-CM | POA: Diagnosis not present

## 2024-06-18 DIAGNOSIS — G4733 Obstructive sleep apnea (adult) (pediatric): Secondary | ICD-10-CM | POA: Diagnosis not present

## 2024-06-25 DIAGNOSIS — N200 Calculus of kidney: Secondary | ICD-10-CM | POA: Diagnosis not present

## 2024-06-25 DIAGNOSIS — R3915 Urgency of urination: Secondary | ICD-10-CM | POA: Diagnosis not present

## 2024-06-26 ENCOUNTER — Telehealth: Payer: Self-pay

## 2024-06-26 NOTE — Telephone Encounter (Signed)
 Office notes faxed to number below. NFN  Copied from CRM [id number]. Topic: Clinical - Order For Equipment >> Jun 03, 2024  2:11 PM Nathanel DEL wrote: Reason for CRM: patricia w/ [employer] calling to follow up on request for face to face notes for pt's cpap.  They have initial notes, they need follow up notes Fax  [phone]

## 2024-07-04 ENCOUNTER — Other Ambulatory Visit: Payer: Self-pay | Admitting: Cardiology

## 2024-07-10 DIAGNOSIS — I10 Essential (primary) hypertension: Secondary | ICD-10-CM | POA: Diagnosis not present

## 2024-07-10 DIAGNOSIS — G4733 Obstructive sleep apnea (adult) (pediatric): Secondary | ICD-10-CM | POA: Diagnosis not present

## 2024-07-10 DIAGNOSIS — K5190 Ulcerative colitis, unspecified, without complications: Secondary | ICD-10-CM | POA: Diagnosis not present

## 2024-07-10 DIAGNOSIS — E1129 Type 2 diabetes mellitus with other diabetic kidney complication: Secondary | ICD-10-CM | POA: Diagnosis not present

## 2024-07-21 DIAGNOSIS — B078 Other viral warts: Secondary | ICD-10-CM | POA: Diagnosis not present

## 2024-07-21 DIAGNOSIS — D225 Melanocytic nevi of trunk: Secondary | ICD-10-CM | POA: Diagnosis not present

## 2024-07-21 DIAGNOSIS — X32XXXD Exposure to sunlight, subsequent encounter: Secondary | ICD-10-CM | POA: Diagnosis not present

## 2024-07-21 DIAGNOSIS — L570 Actinic keratosis: Secondary | ICD-10-CM | POA: Diagnosis not present

## 2024-07-24 DIAGNOSIS — D631 Anemia in chronic kidney disease: Secondary | ICD-10-CM | POA: Diagnosis not present

## 2024-07-24 DIAGNOSIS — R809 Proteinuria, unspecified: Secondary | ICD-10-CM | POA: Diagnosis not present

## 2024-07-29 DIAGNOSIS — M48061 Spinal stenosis, lumbar region without neurogenic claudication: Secondary | ICD-10-CM | POA: Diagnosis not present

## 2024-07-29 DIAGNOSIS — M51369 Other intervertebral disc degeneration, lumbar region without mention of lumbar back pain or lower extremity pain: Secondary | ICD-10-CM | POA: Diagnosis not present

## 2024-07-29 DIAGNOSIS — M47816 Spondylosis without myelopathy or radiculopathy, lumbar region: Secondary | ICD-10-CM | POA: Diagnosis not present

## 2024-07-30 DIAGNOSIS — G4733 Obstructive sleep apnea (adult) (pediatric): Secondary | ICD-10-CM | POA: Diagnosis not present

## 2024-07-30 DIAGNOSIS — I5032 Chronic diastolic (congestive) heart failure: Secondary | ICD-10-CM | POA: Diagnosis not present

## 2024-07-30 DIAGNOSIS — N200 Calculus of kidney: Secondary | ICD-10-CM | POA: Diagnosis not present

## 2024-07-30 DIAGNOSIS — N1831 Chronic kidney disease, stage 3a: Secondary | ICD-10-CM | POA: Diagnosis not present

## 2024-08-01 DIAGNOSIS — R051 Acute cough: Secondary | ICD-10-CM | POA: Diagnosis not present

## 2024-08-01 DIAGNOSIS — K5100 Ulcerative (chronic) pancolitis without complications: Secondary | ICD-10-CM | POA: Diagnosis not present

## 2024-08-01 DIAGNOSIS — D369 Benign neoplasm, unspecified site: Secondary | ICD-10-CM | POA: Diagnosis not present

## 2024-08-18 ENCOUNTER — Ambulatory Visit

## 2024-08-18 DIAGNOSIS — Z91038 Other insect allergy status: Secondary | ICD-10-CM

## 2024-09-03 ENCOUNTER — Other Ambulatory Visit: Payer: Self-pay | Admitting: Internal Medicine

## 2024-09-09 DIAGNOSIS — E1165 Type 2 diabetes mellitus with hyperglycemia: Secondary | ICD-10-CM | POA: Diagnosis not present

## 2024-09-24 ENCOUNTER — Other Ambulatory Visit

## 2024-09-24 ENCOUNTER — Encounter: Payer: Self-pay | Admitting: "Endocrinology

## 2024-09-24 ENCOUNTER — Ambulatory Visit: Admitting: "Endocrinology

## 2024-09-24 VITALS — BP 130/70 | HR 64 | Ht 67.0 in | Wt 184.0 lb

## 2024-09-24 DIAGNOSIS — E1165 Type 2 diabetes mellitus with hyperglycemia: Secondary | ICD-10-CM | POA: Diagnosis not present

## 2024-09-24 DIAGNOSIS — Z7984 Long term (current) use of oral hypoglycemic drugs: Secondary | ICD-10-CM | POA: Diagnosis not present

## 2024-09-24 DIAGNOSIS — Z794 Long term (current) use of insulin: Secondary | ICD-10-CM

## 2024-09-24 MED ORDER — BAQSIMI ONE PACK 3 MG/DOSE NA POWD: 1.0000 | NASAL | 3 refills | Status: AC | PRN

## 2024-09-24 MED ORDER — INSULIN LISPRO (1 UNIT DIAL) 100 UNIT/ML (KWIKPEN): 1.0000 [IU] | PEN_INJECTOR | SUBCUTANEOUS | 11 refills | Status: DC | PRN

## 2024-09-24 NOTE — Patient Instructions (Addendum)
 Will recommend the following: Metformin XR 500mg   Week 1: one tablet after breakfast and two after dinner  Week 2 and onwards: two tablets after breakfast and two after dinner  Glipizide 10 mg bid  Lantus 24 units qam  Humalog correction scale: Use as needed every 4 hours based on blood sugars as follows: 211 - 240: 3 units 241 - 270: 4 units 271 - 300: 5 units 301 - 330: 6 units 331 - 360: 7 units 361 - 390: 8 units 391 - 420: 9 units   __________    Goals of DM therapy:  Morning Fasting blood sugar: 80-140  Blood sugar before meals: 80-140 Bed time blood sugar: 100-150  A1C <7%, limited only by hypoglycemia  1.Diabetes medications and their side effects discussed, including hypoglycemia    2. Check blood glucose:  a) Always check blood sugars before driving. Please see below (under hypoglycemia) on how to manage b) Check a minimum of 3 times/day or more as needed when having symptoms of hypoglycemia.   c) Try to check blood glucose before sleeping/in the middle of the night to ensure that it is remaining stable and not dropping less than 100 d) Check blood glucose more often if sick  3. Diet: a) 3 meals per day schedule b: Restrict carbs to 60-70 grams (4 servings) per meal c) Colorful vegetables - 3 servings a day, and low sugar fruit 2 servings/day Plate control method: 1/4 plate protein, 1/4 starch, 1/2 green, yellow, or red vegetables d) Avoid carbohydrate snacks unless hypoglycemic episode, or increased physical activity  4. Regular exercise as tolerated, preferably 3 or more hours a week  5. Hypoglycemia: a)  Do not drive or operate machinery without first testing blood glucose to assure it is over 90 mg%, or if dizzy, lightheaded, not feeling normal, etc, or  if foot or leg is numb or weak. b)  If blood glucose less than 70, take four 5gm Glucose tabs or 15-30 gm Glucose gel.  Repeat every 15 min as needed until blood sugar is >100 mg/dl. If hypoglycemia  persists then call 911.   6. Sick day management: a) Check blood glucose more often b) Continue usual therapy if blood sugars are elevated.   7. Contact the doctor immediately if blood glucose is frequently <60 mg/dl, or an episode of severe hypoglycemia occurs (where someone had to give you glucose/  glucagon or if you passed out from a low blood glucose), or if blood glucose is persistently >350 mg/dl, for further management  8. A change in level of physical activity or exercise and a change in diet may also affect your blood sugar. Check blood sugars more often and call if needed.  Instructions: 1. Bring glucose meter, blood glucose records on every visit for review 2. Continue to follow up with primary care physician and other providers for medical care 3. Yearly eye  and foot exam 4. Please get blood work done prior to the next appointment

## 2024-09-24 NOTE — Progress Notes (Signed)
 Outpatient Endocrinology Note Joseph Birmingham, MD  09/24/24   Joseph Chase [facility] June 22, 1956 [id number]  Referring Provider: Trudy Vaughn FALCON, MD Primary Care Provider: Trudy Vaughn FALCON, MD Reason for consultation: Subjective   Assessment & Plan  Diagnoses and all orders for this visit:  Uncontrolled type 2 diabetes mellitus with hyperglycemia (HCC) -     Lipid panel; Future -     Microalbumin / creatinine urine ratio -     Lipid panel  Long term (current) use of oral hypoglycemic drugs  Long-term insulin  use (HCC)  Insulin  dose changed (HCC)  Other orders -     Glucagon (BAQSIMI ONE PACK) 3 MG/DOSE POWD; Place 1 Device into the nose as needed (Low blood sugar with impaired consciousness). -     insulin  lispro (HUMALOG KWIKPEN) 100 UNIT/ML KwikPen; Inject 1-10 Units into the skin as needed (blood sugar >200).    Diabetes Type II complicated by hyperglycemia, No results found for: GFR Hba1c goal less than 7, current Hba1c is 7% around 09/14/2024  No results found for: HGBA1C Will recommend the following: Metformin XR 500mg   Week 1: one tablet after breakfast and two after dinner  Week 2 and onwards: two tablets after breakfast and two after dinner Glipizide 10 mg bid Lantus 24 units qam Humalog correction scale: Use as needed every 4 hours  based on blood sugars as follows: 211 - 240: 3 units 241 - 270: 4 units 271 - 300: 5 units 301 - 330: 6 units 331 - 360: 7 units 361 - 390: 8 units 391 - 420: 9 units   No known contraindications/side effects to any of above medications Glucagon discussed and prescribed with refills on 09/24/24   06/11/24: GFR 60 AST 11 ALT 15 A1C 7.8% Hb 14.2  -Last LD and Tg are as follows: No results found for: LDLCALC No results found for: TRIG -On rosuvastatin  10 mg QD -Follow low fat diet and exercise   -Blood pressure goal <140/90 - Microalbumin/creatinine goal is < 30 -Last MA/Cr is as follows: No results  found for: MICROALBUR, MALB24HUR -on ACE/ARB losartan  100mg  every day  -diet changes including salt restriction -limit eating outside -counseled BP targets per standards of diabetes care -uncontrolled blood pressure can lead to retinopathy, nephropathy and cardiovascular and atherosclerotic heart disease  Reviewed and counseled on: -A1C target -Blood sugar targets -Complications of uncontrolled diabetes  -Checking blood sugar before meals and bedtime and bring log next visit -All medications with mechanism of action and side effects -Hypoglycemia management: rule of 15's, Glucagon Emergency Kit and medical alert ID -low-carb low-fat plate-method diet -At least 20 minutes of physical activity per day -Annual dilated retinal eye exam and foot exam -compliance and follow up needs -follow up as scheduled or earlier if problem gets worse  Call if blood sugar is less than 70 or consistently above 250    Take a 15 gm snack of carbohydrate at bedtime before you go to sleep if your blood sugar is less than 100.    If you are going to fast after midnight for a test or procedure, ask your physician for instructions on how to reduce/decrease your insulin  dose.    Call if blood sugar is less than 70 or consistently above 250  -Treating a low sugar by rule of 15  (15 gms of sugar every 15 min until sugar is more than 70) If you feel your sugar is low, test your sugar to be sure If your sugar  is low (less than 70), then take 15 grams of a fast acting Carbohydrate (3-4 glucose tablets or glucose gel or 4 ounces of juice or regular soda) Recheck your sugar 15 min after treating low to make sure it is more than 70 If sugar is still less than 70, treat again with 15 grams of carbohydrate          Don't drive the hour of hypoglycemia  If unconscious/unable to eat or drink by mouth, use glucagon injection or nasal spray baqsimi and call 911. Can repeat again in 15 min if still unconscious.  Return  in about 2 months (around 11/24/2024) for visit and 8 am labs before next visit.   I have reviewed current medications, nurse's notes, allergies, vital signs, past medical and surgical history, family medical history, and social history for this encounter. Counseled patient on symptoms, examination findings, lab findings, imaging results, treatment decisions and monitoring and prognosis. The patient understood the recommendations and agrees with the treatment plan. All questions regarding treatment plan were fully answered.  Joseph Birmingham, MD  09/24/24    History of Present Illness Joseph Chase is a 68 y.o. year old male who presents for evaluation of Type II diabetes mellitus.  Joseph Chase [facility] Chase was first diagnosed in 2014.   Diabetes education -  Home diabetes regimen: Metformin XR 500mg  twice day  Glipizide 10 mg bid Lantuss 24 units qam  COMPLICATIONS -  MI/Stroke -  retinopathy -  neuropathy -  nephropathy  SYMPTOMS REVIEWED - Polyuria - Unintentional weight loss - Blurred vision  BLOOD SUGAR DATA CGM interpretation: At today's visit, we reviewed her CGM downloads. The full report is scanned in the media. Reviewing the CGM trends, BG are high between 8am-2pm, and improve thereafter.   Physical Exam  BP 130/70   Pulse 64   Ht 5' 7 (1.702 m)   Wt 184 lb (83.5 kg)   SpO2 97%   BMI 28.82 kg/m    Constitutional: well developed, well nourished Head: normocephalic, atraumatic Eyes: sclera anicteric, no redness Neck: supple Lungs: normal respiratory effort Neurology: alert and oriented Skin: dry, no appreciable rashes Musculoskeletal: no appreciable defects Psychiatric: normal mood and affect Diabetic Foot Exam - Simple   No data filed      Current Medications Patient's Medications  New Prescriptions   GLUCAGON (BAQSIMI ONE PACK) 3 MG/DOSE POWD    Place 1 Device into the nose as needed (Low blood sugar with impaired consciousness).   INSULIN  LISPRO  (HUMALOG KWIKPEN) 100 UNIT/ML KWIKPEN    Inject 1-10 Units into the skin as needed (blood sugar >200).  Previous Medications   ACETAMINOPHEN  (TYLENOL ) 500 MG TABLET    Take 2 tablets (1,000 mg total) by mouth every 6 (six) hours as needed for mild pain.   AMLODIPINE  (NORVASC ) 10 MG TABLET    take 1 tablet (10 milligram total) by mouth daily.   AZATHIOPRINE  (IMURAN ) 50 MG TABLET    Take 200 mg by mouth daily.   CHLORTHALIDONE  (HYGROTON ) 25 MG TABLET    take 1 tablet (25 milligram total) by mouth daily.   COENZYME Q10 (CO Q 10) 100 MG CAPS    Take 1 capsule by mouth daily.   EPINEPHRINE  (EPIPEN  2-PAK) 0.3 MG/0.3 ML IJ SOAJ INJECTION    Inject 0.3 mg into the muscle daily as needed. For severe life-threatening allergic reaction   FEXOFENADINE (ALLEGRA) 180 MG TABLET    Take 180 mg by mouth daily.   GABAPENTIN  (  NEURONTIN ) 100 MG CAPSULE    Take 200 mg by mouth at bedtime.   GLIPIZIDE (GLUCOTROL) 5 MG TABLET    Take 5 mg by mouth 2 (two) times daily.   LANTUS SOLOSTAR 100 UNIT/ML SOLOSTAR PEN    Inject 24 Units into the skin daily.   LOSARTAN  (COZAAR ) 100 MG TABLET    Take 100 mg by mouth daily.   METFORMIN (GLUCOPHAGE-XR) 500 MG 24 HR TABLET    Take 500 mg by mouth 2 (two) times daily with a meal.   METOPROLOL  SUCCINATE (TOPROL -XL) 25 MG 24 HR TABLET    Take 75 mg by mouth daily.   MULTIPLE VITAMIN (THERA) TABS    Take 1 tablet by mouth daily.   ROSUVASTATIN  (CRESTOR ) 10 MG TABLET    Take 10 mg by mouth daily.   SILDENAFIL  (REVATIO ) 20 MG TABLET    Take 3-5 tablets 30 minutes before intercourse.   ZOLPIDEM  (AMBIEN ) 10 MG TABLET    Take 10 mg by mouth at bedtime.  Modified Medications   No medications on file  Discontinued Medications   No medications on file    Allergies Allergies  Allergen Reactions   Venomil Mixed Vespid [Mixed Vespid Venom]    Venomil Wasp Venom [Wasp Venom]    Penicillins Rash    Past Medical History Past Medical History:  Diagnosis Date   Borderline diabetes     Diabetes mellitus without complication (HCC)    Elevated cholesterol    Hypertension    Ulcerative colitis [facility])     Past Surgical History Past Surgical History:  Procedure Laterality Date   BREAST MASS EXCISION Right October 12,2016   Per patient, non malignant   LAPAROSCOPIC APPENDECTOMY N/A 10/12/2022   Procedure: APPENDECTOMY LAPAROSCOPIC;  Surgeon: Rubin Calamity, MD;  Location: [facility] OR;  Service: General;  Laterality: N/A;   MEDIAL PARTIAL KNEE REPLACEMENT Right 6 yrs ago    Family History family history includes Alzheimer's disease in his mother; Heart attack in his father; Stroke in his father.  Social History Social History   Socioeconomic History   Marital status: Widowed    Spouse name: Not on file   Number of children: Not on file   Years of education: Not on file   Highest education level: Not on file  Occupational History   Not on file  Tobacco Use   Smoking status: Never   Smokeless tobacco: Never  Substance and Sexual Activity   Alcohol use: Never    Alcohol/week: 0.0 standard drinks of alcohol   Drug use: Never   Sexual activity: Not on file  Other Topics Concern   Not on file  Social History Narrative   Not on file   Social Drivers of Health   Financial Resource Strain: Patient Declined (02/25/2024)   Received from [facility]   Overall Financial Resource Strain (CARDIA)    Difficulty of Paying Living Expenses: Patient declined  Food Insecurity: Patient Declined (02/25/2024)   Received from [facility]   Hunger Vital Sign    Within the past 12 months, you worried that your food would run out before you got the money to buy more.: Patient declined    Within the past 12 months, the food you bought just didn't last and you didn't have money to get more.: Patient declined  Transportation Needs: Patient Declined (02/25/2024)   Received from [facility] - Transportation    Lack of Transportation (Medical): Patient declined    Lack of  Transportation (Non-Medical): Patient declined  Physical Activity: Inactive (02/25/2024)   Received from [facility]   Exercise Vital Sign    On average, how many days per week do you engage in moderate to strenuous exercise (like a brisk walk)?: 0 days    On average, how many minutes do you engage in exercise at this level?: 20 min  Stress: Patient Declined (02/25/2024)   Received from [facility] - Occupational Stress Questionnaire    Feeling of Stress : Patient declined  Social Connections: Patient Declined (02/25/2024)   Received from [facility]   Social Network    How would you rate your social network (family, work, friends)?: Patient declined  Intimate Partner Violence: Patient Declined (02/25/2024)   Received from [facility]   HITS    Over the last 12 months how often did your partner physically hurt you?: Patient declined    Over the last 12 months how often did your partner insult you or talk down to you?: Patient declined    Over the last 12 months how often did your partner threaten you with physical harm?: Patient declined    Over the last 12 months how often did your partner scream or curse at you?: Patient declined    No results found for: HGBA1C No results found for: CHOL No results found for: HDL No results found for: LDLCALC No results found for: TRIG No results found for: [facility] Lab Results  Component Value Date   CREATININE 1.22 04/24/2023   No results found for: GFR No results found for: MACKEY CURRENT    Component Value Date/Time   NA 138 04/24/2023 1542   K 3.9 04/24/2023 1542   CL 97 04/24/2023 1542   CO2 28 04/24/2023 1542   GLUCOSE 164 (H) 04/24/2023 1542   GLUCOSE 105 (H) 10/11/2022 2220   BUN 20 04/24/2023 1542   CREATININE 1.22 04/24/2023 1542   CALCIUM  10.0 04/24/2023 1542   GFRNONAA >60 10/11/2022 2220   GFRAA (L) 03/23/2011 1030    49        The eGFR has been  calculated using the MDRD equation. This calculation has not been validated in all clinical situations. eGFR's persistently <60 mL/min signify possible Chronic Kidney Disease.      Latest Ref Rng & Units 04/24/2023    3:42 PM 10/11/2022   10:20 PM 03/23/2011   10:30 AM  BMP  Glucose 70 - 99 mg/dL 835  894  878   BUN 8 - 27 mg/dL 20  19  21    Creatinine 0.76 - 1.27 mg/dL 8.77  8.76  8.23   BUN/Creat Ratio 10 - 24 16     Sodium 134 - 144 mmol/L 138  140  134   Potassium 3.5 - 5.2 mmol/L 3.9  3.7  4.1   Chloride 96 - 106 mmol/L 97  101  95   CO2 20 - 29 mmol/L 28  26  30    Calcium  8.6 - 10.2 mg/dL 89.9  9.4  9.4        Component Value Date/Time   WBC 9.3 10/11/2022 2220   RBC 4.84 10/11/2022 2220   HGB 15.1 10/11/2022 2220   HCT 43.2 10/11/2022 2220   PLT 186 10/11/2022 2220   MCV 89.3 10/11/2022 2220   MCH 31.2 10/11/2022 2220   MCHC 35.0 10/11/2022 2220   RDW 13.0 10/11/2022 2220   LYMPHSABS 1.6 10/11/2022 2220   MONOABS 0.8 10/11/2022 2220  EOSABS 0.2 10/11/2022 2220   BASOSABS 0.1 10/11/2022 2220     Parts of this note may have been dictated using voice recognition software. There may be variances in spelling and vocabulary which are unintentional. Not all errors are proofread. Please notify the dino if any discrepancies are noted or if the meaning of any statement is not clear.

## 2024-09-25 ENCOUNTER — Encounter: Payer: Self-pay | Admitting: "Endocrinology

## 2024-09-25 DIAGNOSIS — E1165 Type 2 diabetes mellitus with hyperglycemia: Secondary | ICD-10-CM

## 2024-09-26 LAB — MICROALBUMIN / CREATININE URINE RATIO
Creatinine, Urine: 165 mg/dL (ref 20–320)
Microalb Creat Ratio: 337 mg/g{creat} — ABNORMAL HIGH (ref <30)
Microalb, Ur: 55.6 mg/dL

## 2024-09-26 MED ORDER — METFORMIN HCL ER 500 MG PO TB24
500.0000 mg | ORAL_TABLET | Freq: Two times a day (BID) | ORAL | 1 refills | Status: DC
Start: 2024-09-26 — End: 2024-10-07

## 2024-10-02 ENCOUNTER — Other Ambulatory Visit: Payer: Self-pay | Admitting: Cardiology

## 2024-10-07 MED ORDER — METFORMIN HCL ER 500 MG PO TB24: 500.0000 mg | ORAL_TABLET | Freq: Two times a day (BID) | ORAL | 1 refills | Status: DC

## 2024-10-07 NOTE — Progress Notes (Shared)
 [facility] - Clinic Note  10/20/2024   CHIEF COMPLAINT Patient presents for No chief complaint on file.  HISTORY OF PRESENT ILLNESS: Joseph Chase is a 68 y.o. male who presents to the clinic today for:   Referring physician: Trudy Vaughn FALCON, MD [address] [place],  [place] [address]  HISTORICAL INFORMATION:  Selected notes from the MEDICAL RECORD NUMBER Referred by Dr. JAMA:  Ocular Hx- PMH-   CURRENT MEDICATIONS: No current outpatient medications on file. (Ophthalmic Drugs)   No current facility-administered medications for this visit. (Ophthalmic Drugs)   Current Outpatient Medications (Other)  Medication Sig   acetaminophen  (TYLENOL ) 500 MG tablet Take 2 tablets (1,000 mg total) by mouth every 6 (six) hours as needed for mild pain.   amLODipine  (NORVASC ) 10 MG tablet take 1 tablet (10 milligram total) by mouth daily.   azaTHIOprine  (IMURAN ) 50 MG tablet Take 200 mg by mouth daily.   chlorthalidone  (HYGROTON ) 25 MG tablet take 1 tablet (25 milligram total) by mouth daily.   Coenzyme Q10 (CO Q 10) 100 MG CAPS Take 1 capsule by mouth daily.   EPINEPHrine  (EPIPEN  2-PAK) 0.3 mg/0.3 mL IJ SOAJ injection Inject 0.3 mg into the muscle daily as needed. For severe life-threatening allergic reaction   fexofenadine (ALLEGRA) 180 MG tablet Take 180 mg by mouth daily.   gabapentin  (NEURONTIN ) 100 MG capsule Take 200 mg by mouth at bedtime.   glipiZIDE (GLUCOTROL) 5 MG tablet Take 5 mg by mouth 2 (two) times daily.   Glucagon  (BAQSIMI  ONE PACK) 3 MG/DOSE POWD Place 1 Device into the nose as needed (Low blood sugar with impaired consciousness).   insulin  lispro (HUMALOG  KWIKPEN) 100 UNIT/ML KwikPen Inject 1-10 Units into the skin as needed (blood sugar >200).   LANTUS SOLOSTAR 100 UNIT/ML Solostar Pen Inject 24 Units into the skin daily.   losartan  (COZAAR ) 100 MG tablet Take 100 mg by mouth daily.   metFORMIN  (GLUCOPHAGE -XR) 500 MG 24 hr tablet Take 1 tablet  (500 mg total) by mouth 2 (two) times daily with a meal. Take two tablets after breakfast and two after dinner.   metoprolol  succinate (TOPROL -XL) 25 MG 24 hr tablet Take 75 mg by mouth daily.   Multiple Vitamin (THERA) TABS Take 1 tablet by mouth daily.   rosuvastatin  (CRESTOR ) 10 MG tablet Take 10 mg by mouth daily.   sildenafil  (REVATIO ) 20 MG tablet Take 3-5 tablets 30 minutes before intercourse.   zolpidem  (AMBIEN ) 10 MG tablet Take 10 mg by mouth at bedtime.   No current facility-administered medications for this visit. (Other)   REVIEW OF SYSTEMS:  ALLERGIES Allergies  Allergen Reactions   Venomil Mixed Vespid [Mixed Vespid Venom]    Venomil Wasp Venom [Wasp Venom]    Penicillins Rash   PAST MEDICAL HISTORY Past Medical History:  Diagnosis Date   Borderline diabetes    Diabetes mellitus without complication (HCC)    Elevated cholesterol    Hypertension    Ulcerative colitis [facility])    Past Surgical History:  Procedure Laterality Date   BREAST MASS EXCISION Right October 12,2016   Per patient, non malignant   LAPAROSCOPIC APPENDECTOMY N/A 10/12/2022   Procedure: APPENDECTOMY LAPAROSCOPIC;  Surgeon: Rubin Calamity, MD;  Location: [facility] OR;  Service: General;  Laterality: N/A;   MEDIAL PARTIAL KNEE REPLACEMENT Right 6 yrs ago   FAMILY HISTORY Family History  Problem Relation Age of Onset   Alzheimer's disease Mother    Heart attack Father  Stroke Father    Allergic rhinitis Neg Hx    Angioedema Neg Hx    Asthma Neg Hx    Eczema Neg Hx    Immunodeficiency Neg Hx    Urticaria Neg Hx    SOCIAL HISTORY Social History   Tobacco Use   Smoking status: Never   Smokeless tobacco: Never  Substance Use Topics   Alcohol use: Never    Alcohol/week: 0.0 standard drinks of alcohol   Drug use: Never       OPHTHALMIC EXAM:  Not recorded    IMAGING AND PROCEDURES  Imaging and Procedures for 10/20/2024        ASSESSMENT/PLAN:   ICD-10-CM   1. Retinal edema of  both eyes  H35.81      1.  2.  3.  Ophthalmic Meds Ordered this visit:  No orders of the defined types were placed in this encounter.    No follow-ups on file.  There are no Patient Instructions on file for this visit.  Explained the diagnoses, plan, and follow up with the patient and they expressed understanding.  Patient expressed understanding of the importance of proper follow up care.   This document serves as a record of services personally performed by Redell JUDITHANN Hans, MD, PhD. It was created on their behalf by Avelina Pereyra, COA an ophthalmic technician. The creation of this record is the provider's dictation and/or activities during the visit.   Electronically signed by: Avelina GORMAN Pereyra, COT  10/07/24  11:11 AM   Redell JUDITHANN Hans, M.D., Ph.D. Diseases & Surgery of the Retina and Vitreous [facility] 10/20/2024  Abbreviations: M myopia (nearsighted); A astigmatism; H hyperopia (farsighted); P presbyopia; Mrx spectacle prescription;  CTL contact lenses; OD right eye; OS left eye; OU both eyes  XT exotropia; ET esotropia; PEK punctate epithelial keratitis; PEE punctate epithelial erosions; DES dry eye syndrome; MGD meibomian gland dysfunction; ATs artificial tears; PFAT's preservative free artificial tears; NSC nuclear sclerotic cataract; PSC posterior subcapsular cataract; ERM epi-retinal membrane; PVD posterior vitreous detachment; RD retinal detachment; DM diabetes mellitus; DR diabetic retinopathy; NPDR non-proliferative diabetic retinopathy; PDR proliferative diabetic retinopathy; CSME clinically significant macular edema; DME diabetic macular edema; dbh dot blot hemorrhages; CWS cotton wool spot; POAG primary open angle glaucoma; C/D cup-to-disc ratio; HVF humphrey visual field; GVF goldmann visual field; OCT optical coherence tomography; IOP intraocular pressure; BRVO Branch retinal vein occlusion; CRVO central retinal vein occlusion; CRAO central  retinal artery occlusion; BRAO branch retinal artery occlusion; RT retinal tear; SB scleral buckle; PPV pars plana vitrectomy; VH Vitreous hemorrhage; PRP panretinal laser photocoagulation; IVK intravitreal kenalog ; VMT vitreomacular traction; MH Macular hole;  NVD neovascularization of the disc; NVE neovascularization elsewhere; AREDS age related eye disease study; ARMD age related macular degeneration; POAG primary open angle glaucoma; EBMD epithelial/anterior basement membrane dystrophy; ACIOL anterior chamber intraocular lens; IOL intraocular lens; PCIOL posterior chamber intraocular lens; Phaco/IOL phacoemulsification with intraocular lens placement; PRK photorefractive keratectomy; LASIK laser assisted in situ keratomileusis; HTN hypertension; DM diabetes mellitus; COPD chronic obstructive pulmonary disease

## 2024-10-07 NOTE — Addendum Note (Signed)
 Addended by: ARLOA JEOFFREY SAILOR on: 10/07/2024 08:25 AM   Modules accepted: Orders

## 2024-10-20 ENCOUNTER — Other Ambulatory Visit: Payer: Self-pay | Admitting: *Deleted

## 2024-10-20 ENCOUNTER — Encounter (INDEPENDENT_AMBULATORY_CARE_PROVIDER_SITE_OTHER): Payer: Self-pay

## 2024-10-20 ENCOUNTER — Encounter (INDEPENDENT_AMBULATORY_CARE_PROVIDER_SITE_OTHER): Admitting: Ophthalmology

## 2024-10-20 DIAGNOSIS — H3581 Retinal edema: Secondary | ICD-10-CM

## 2024-10-20 MED ORDER — CHLORTHALIDONE 25 MG PO TABS: 25.0000 mg | ORAL_TABLET | Freq: Every day | ORAL | 1 refills | Status: AC

## 2024-10-20 MED ORDER — METFORMIN HCL ER 500 MG PO TB24: 500.0000 mg | ORAL_TABLET | Freq: Two times a day (BID) | ORAL | 1 refills | Status: DC

## 2024-10-20 NOTE — Addendum Note (Signed)
 Addended by: ARLOA JEOFFREY SAILOR on: 10/20/2024 09:33 AM   Modules accepted: Orders

## 2024-10-23 ENCOUNTER — Other Ambulatory Visit: Payer: Self-pay | Admitting: "Endocrinology

## 2024-10-23 DIAGNOSIS — E1165 Type 2 diabetes mellitus with hyperglycemia: Secondary | ICD-10-CM

## 2024-10-27 NOTE — Progress Notes (Signed)
 [facility] - Clinic Note  10/29/2024   CHIEF COMPLAINT Patient presents for Retina Evaluation  HISTORY OF PRESENT ILLNESS: Joseph Chase is a 68 y.o. male who presents to the clinic today for:  HPI     Retina Evaluation   In both eyes.  I, the attending physician,  performed the HPI with the patient and updated documentation appropriately.        Comments   Patient here for Retina Evaluation. Patient states vision is ok. No eye pain. Dr Nicholaus saw wet ARMD.      Last edited by Valdemar Rogue, MD on 10/29/2024  8:58 PM.    Patient states he can see a blob in the left eye.  Referring physician: Willma Nicholaus, OD [address] [place],  [place] [address]  HISTORICAL INFORMATION:  Selected notes from the MEDICAL RECORD NUMBER Referred by Dr. Willma Nicholaus, OD for possible exudative ARMD OS LEE:  Ocular Hx- PMH-   CURRENT MEDICATIONS: No current outpatient medications on file. (Ophthalmic Drugs)   No current facility-administered medications for this visit. (Ophthalmic Drugs)   Current Outpatient Medications (Other)  Medication Sig   acetaminophen  (TYLENOL ) 500 MG tablet Take 2 tablets (1,000 mg total) by mouth every 6 (six) hours as needed for mild pain.   amLODipine  (NORVASC ) 10 MG tablet take 1 tablet (10 milligram total) by mouth daily.   azaTHIOprine  (IMURAN ) 50 MG tablet Take 200 mg by mouth daily.   chlorthalidone  (HYGROTON ) 25 MG tablet Take 1 tablet (25 mg total) by mouth daily.   Coenzyme Q10 (CO Q 10) 100 MG CAPS Take 1 capsule by mouth daily.   EPINEPHrine  (EPIPEN  2-PAK) 0.3 mg/0.3 mL IJ SOAJ injection Inject 0.3 mg into the muscle daily as needed. For severe life-threatening allergic reaction   fexofenadine (ALLEGRA) 180 MG tablet Take 180 mg by mouth daily.   glipiZIDE (GLUCOTROL) 5 MG tablet Take 5 mg by mouth 2 (two) times daily. (Patient taking differently: Take 10 mg by mouth 2 (two) times daily.)   insulin  lispro  (HUMALOG  KWIKPEN) 100 UNIT/ML KwikPen Inject 1-10 Units into the skin as needed (blood sugar >200).   LANTUS SOLOSTAR 100 UNIT/ML Solostar Pen Inject 24 Units into the skin daily.   losartan  (COZAAR ) 100 MG tablet Take 100 mg by mouth daily.   metFORMIN  (GLUCOPHAGE -XR) 500 MG 24 hr tablet Take 2 tablets (1,000 mg total) by mouth 2 (two) times daily with a meal.   metoprolol  succinate (TOPROL -XL) 25 MG 24 hr tablet Take 75 mg by mouth daily.   Multiple Vitamin (THERA) TABS Take 1 tablet by mouth daily.   rosuvastatin  (CRESTOR ) 10 MG tablet Take 10 mg by mouth daily.   sildenafil  (REVATIO ) 20 MG tablet Take 3-5 tablets 30 minutes before intercourse.   zolpidem  (AMBIEN ) 10 MG tablet Take 10 mg by mouth at bedtime.   gabapentin  (NEURONTIN ) 100 MG capsule Take 200 mg by mouth at bedtime. (Patient not taking: Reported on 10/29/2024)   Glucagon  (BAQSIMI  ONE PACK) 3 MG/DOSE POWD Place 1 Device into the nose as needed (Low blood sugar with impaired consciousness). (Patient not taking: Reported on 10/29/2024)   No current facility-administered medications for this visit. (Other)   REVIEW OF SYSTEMS: ROS   Positive for: Endocrine, Cardiovascular Negative for: Eyes Last edited by Orval Asberry RAMAN, COA on 10/29/2024  8:48 AM.     ALLERGIES Allergies[1] PAST MEDICAL HISTORY Past Medical History:  Diagnosis Date   Borderline diabetes  Diabetes mellitus without complication (HCC)    Elevated cholesterol    Hypertension    Ulcerative colitis [facility])    Past Surgical History:  Procedure Laterality Date   BREAST MASS EXCISION Right October 12,2016   Per patient, non malignant   LAPAROSCOPIC APPENDECTOMY N/A 10/12/2022   Procedure: APPENDECTOMY LAPAROSCOPIC;  Surgeon: Rubin Calamity, MD;  Location: [facility] OR;  Service: General;  Laterality: N/A;   MEDIAL PARTIAL KNEE REPLACEMENT Right 6 yrs ago   FAMILY HISTORY Family History  Problem Relation Age of Onset   Alzheimer's disease Mother    Heart  attack Father    Stroke Father    Allergic rhinitis Neg Hx    Angioedema Neg Hx    Asthma Neg Hx    Eczema Neg Hx    Immunodeficiency Neg Hx    Urticaria Neg Hx    SOCIAL HISTORY Social History[2]     OPHTHALMIC EXAM:  Base Eye Exam     Visual Acuity (Snellen - Linear)       Right Left   Dist [place] 20/20 -1 20/40 -2   Dist ph [place]  20/30 +1         Tonometry (Tonopen, 8:45 AM)       Right Left   Pressure 20 20         Pupils       Dark Light Shape React APD   Right 3 2 Round Brisk None   Left 3 2 Round Brisk None         Visual Fields (Counting fingers)       Left Right    Full Full         Extraocular Movement       Right Left    Full, Ortho Full, Ortho         Neuro/Psych     Oriented x3: Yes         Dilation     Both eyes: 1.0% Mydriacyl, 2.5% Phenylephrine  @ 8:44 AM           Slit Lamp and Fundus Exam     External Exam       Right Left   External Normal Normal         Slit Lamp Exam       Right Left   Lids/Lashes Dermatochalasis - upper lid Dermatochalasis - upper lid   Conjunctiva/Sclera Nasal and temporal Pinguecula Nasal and temporal Pinguecula   Cornea 1+ Punctate epithelial erosions, Debris in tear film 1+ Punctate epithelial erosions, Debris in tear film   Anterior Chamber Deep and clear Deep and clear   Iris Round and dilated, No NVI Round and dilated, No NVI   Lens 2+ Nuclear sclerosis, 2+ Cortical cataract 2-3+ Nuclear sclerosis, 2-3+ Cortical cataract   Anterior Vitreous Vitreous syneresis Vitreous syneresis         Fundus Exam       Right Left   Disc Pink and sharp Pink and sharp, Compact   C/D Ratio 0.3 0.4   Macula Flat, Good foveal reflex, focal drusen and RPE mottling IT to fovea, No heme Flat, Blunted foveal reflex, RPE mottling, Drusen, punctate  CNV w/ edema inferior fovea, no frank heme   Vessels Tortuous, Vascular attenuation Tortuous, Vascular attenuation   Periphery Attached, No heme  Attached, No heme           IMAGING AND PROCEDURES  Imaging and Procedures for 10/29/2024  OCT, Retina - OU - Both Eyes  Right Eye Quality was good. Central Foveal Thickness: 291. Progression has no prior data. Findings include normal foveal contour, no IRF, no SRF, retinal drusen , vitreomacular adhesion (Focal patch of drusen and ellipsoid thinning IT mac).   Left Eye Quality was good. Central Foveal Thickness: 345. Progression has no prior data. Findings include no SRF, abnormal foveal contour, retinal drusen , subretinal hyper-reflective material, intraretinal hyper-reflective material, pigment epithelial detachment (Focal CNV with surrounding IRF/edema inferior fovea).   Notes *Images captured and stored on drive  Diagnosis / Impression:  OD: nonexudative ARMD: Focal patch of drusen and ellipsoid thinning IT mac, no DME OS: exudative ARMD: Focal CNV with surrounding IRF/edema inferior fovea, no DME  Clinical management:  See below  Abbreviations: NFP - Normal foveal profile. CME - cystoid macular edema. PED - pigment epithelial detachment. IRF - intraretinal fluid. SRF - subretinal fluid. EZ - ellipsoid zone. ERM - epiretinal membrane. ORA - outer retinal atrophy. ORT - outer retinal tubulation. SRHM - subretinal hyper-reflective material. IRHM - intraretinal hyper-reflective material      Fluorescein  Angiography Optos (Transit OS)       Right Eye Progression has no prior data. Early phase findings include normal observations. Mid/Late phase findings include staining (Mild focal staining IT to fovea).   Left Eye Progression has no prior data. Early phase findings include staining. Mid/Late phase findings include leakage (Mild late leakage peri fovea greatest inferior to fovea-- +CNV).   Notes *Images captured and stored on drive  Diagnosis / Impression:  OD: Mild focal staining IT to fovea OS: Mild late leakage perifovea greatest inferior to fovea--  +CNV  Clinical management:  See below  Abbreviations: NFP - Normal foveal profile. CME - cystoid macular edema. PED - pigment epithelial detachment. IRF - intraretinal fluid. SRF - subretinal fluid. EZ - ellipsoid zone. ERM - epiretinal membrane. ORA - outer retinal atrophy. ORT - outer retinal tubulation. SRHM - subretinal hyper-reflective material. IRHM - intraretinal hyper-reflective material      Intravitreal Injection, Pharmacologic Agent - OS - Left Eye       Time Out 10/29/2024. 10:36 AM. Confirmed correct patient, procedure, site, and patient consented.   Anesthesia Topical anesthesia was used. Anesthetic medications included Lidocaine  2%, Proparacaine 0.5%.   Procedure Preparation included 5% betadine to ocular surface, eyelid speculum. A supplied needle was used.   Injection: 1.25 mg Bevacizumab  1.25mg /0.22ml   Route: Intravitreal, Site: Left Eye   NDC: 49757-939-98, Lot: [id number], Expiration date: 11/29/2024   Post-op Post injection exam found visual acuity of at least counting fingers. The patient tolerated the procedure well. There were no complications. The patient received written and verbal post procedure care education.           ASSESSMENT/PLAN:   ICD-10-CM   1. Exudative age-related macular degeneration of left eye with active choroidal neovascularization (HCC)  H35.3221 OCT, Retina - OU - Both Eyes    Fluorescein  Angiography Optos (Transit OS)    Intravitreal Injection, Pharmacologic Agent - OS - Left Eye    Bevacizumab  (AVASTIN ) SOLN 1.25 mg    2. 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 Diabetes mellitus without complication (HCC)  E11.9     4. Diabetes mellitus treated with oral medication (HCC)  E11.9    Z79.84     5. Encounter for long-term (current) use of insulin  (HCC)  Z79.4     6. Essential (primary) hypertension  I10     7.  Hypertensive retinopathy of both eyes  H35.033 Fluorescein   Angiography Optos (Transit OS)    8. Combined forms of age-related cataract of both eyes  H25.813      Exudative age related macular degeneration, OS  - The incidence pathology and anatomy of wet AMD discussed  - discussed treatment options including observation vs intravitreal anti-VEGF agents such as Avastin , Lucentis, Eylea.   - Risks of endophthalmitis and vascular occlusive events and atrophic changes discussed with patient - OCT OS: Focal CNV with surrounding IRF/edema inferior fovea, no DME - FA (12.17.25) shows OD: Mild focal staining IT to fovea; OS: Mild late leakage peri fovea greatest inferior to fovea-- +CNV  - recommend IVA OS #1 today, 12.17.25  - pt wishes to be treated with IVA OS - RBA of procedure discussed, questions answered - informed consent obtained and signed 12.17.25 - see procedure note  - f/u in 4 wks -- DFE/OCT, possible injection   2. Age related macular degeneration, non-exudative, OD  - OCT OD: Focal patch of drusen and ellipsoid thinning IT mac - The incidence, anatomy, and pathology of dry AMD, risk of progression, and the AREDS and AREDS 2 studies including smoking risks discussed with patient.  - Recommend amsler grid monitoring  - f/u 4 weeks - DFE, OCT   3-5. Diabetes mellitus, type 2 without retinopathy  - A1C 7.5 (12.17.25) - The incidence, risk factors for progression, natural history and treatment options for diabetic retinopathy  were discussed with patient.   - The need for close monitoring of blood glucose, blood pressure, and serum lipids, avoiding cigarette or any type of tobacco, and the need for long term follow up was also discussed with patient. - f/u in 1 year, sooner prn   6,7. Hypertensive retinopathy OU - discussed importance of tight BP control - monitor   8. Mixed Cataract OU - The symptoms of cataract, surgical options, and treatments and risks were discussed with patient. - discussed diagnosis and progression - monitor    Ophthalmic Meds Ordered this visit:  Meds ordered this encounter  Medications   Bevacizumab  (AVASTIN ) SOLN 1.25 mg     Return in about 4 weeks (around 11/26/2024) for f/u, Ex. AMD, DFE, OCT, Possible, IVA, OS.  There are no Patient Instructions on file for this visit.  Explained the diagnoses, plan, and follow up with the patient and they expressed understanding.  Patient expressed understanding of the importance of proper follow up care.   This document serves as a record of services personally performed by Redell JUDITHANN Hans, MD, PhD. It was created on their behalf by Almetta Pesa, an ophthalmic technician. The creation of this record is the provider's dictation and/or activities during the visit.    Electronically signed by: Almetta Pesa, OA, 10/29/2024  9:10 PM  This document serves as a record of services personally performed by Redell JUDITHANN Hans, MD, PhD. It was created on their behalf by Wanda GEANNIE Keens, COT an ophthalmic technician. The creation of this record is the provider's dictation and/or activities during the visit.    Electronically signed by:  Wanda GEANNIE Keens, COT  10/29/2024 9:10 PM  Redell JUDITHANN Hans, M.D., Ph.D. Diseases & Surgery of the Retina and Vitreous [facility] 10/29/2024  I have reviewed the above documentation for accuracy and completeness, and I agree with the above. Redell JUDITHANN Hans, M.D., Ph.D. 10/29/2024 9:14 PM   Abbreviations: M myopia (nearsighted); A astigmatism; H hyperopia (farsighted); P presbyopia; Mrx spectacle  prescription;  CTL contact lenses; OD right eye; OS left eye; OU both eyes  XT exotropia; ET esotropia; PEK punctate epithelial keratitis; PEE punctate epithelial erosions; DES dry eye syndrome; MGD meibomian gland dysfunction; ATs artificial tears; PFAT's preservative free artificial tears; NSC nuclear sclerotic cataract; PSC posterior subcapsular cataract; ERM epi-retinal membrane; PVD posterior vitreous  detachment; RD retinal detachment; DM diabetes mellitus; DR diabetic retinopathy; NPDR non-proliferative diabetic retinopathy; PDR proliferative diabetic retinopathy; CSME clinically significant macular edema; DME diabetic macular edema; dbh dot blot hemorrhages; CWS cotton wool spot; POAG primary open angle glaucoma; C/D cup-to-disc ratio; HVF humphrey visual field; GVF goldmann visual field; OCT optical coherence tomography; IOP intraocular pressure; BRVO Branch retinal vein occlusion; CRVO central retinal vein occlusion; CRAO central retinal artery occlusion; BRAO branch retinal artery occlusion; RT retinal tear; SB scleral buckle; PPV pars plana vitrectomy; VH Vitreous hemorrhage; PRP panretinal laser photocoagulation; IVK intravitreal kenalog ; VMT vitreomacular traction; MH Macular hole;  NVD neovascularization of the disc; NVE neovascularization elsewhere; AREDS age related eye disease study; ARMD age related macular degeneration; POAG primary open angle glaucoma; EBMD epithelial/anterior basement membrane dystrophy; ACIOL anterior chamber intraocular lens; IOL intraocular lens; PCIOL posterior chamber intraocular lens; Phaco/IOL phacoemulsification with intraocular lens placement; PRK photorefractive keratectomy; LASIK laser assisted in situ keratomileusis; HTN hypertension; DM diabetes mellitus; COPD chronic obstructive pulmonary disease      [1]  Allergies Allergen Reactions   Venomil Mixed Vespid [Mixed Vespid Venom]    Venomil Wasp Venom [Wasp Venom]    Penicillins Rash  [2]  Social History Tobacco Use   Smoking status: Never   Smokeless tobacco: Never  Substance Use Topics   Alcohol use: Never    Alcohol/week: 0.0 standard drinks of alcohol   Drug use: Never

## 2024-10-29 ENCOUNTER — Ambulatory Visit (INDEPENDENT_AMBULATORY_CARE_PROVIDER_SITE_OTHER): Admitting: Ophthalmology

## 2024-10-29 ENCOUNTER — Encounter (INDEPENDENT_AMBULATORY_CARE_PROVIDER_SITE_OTHER): Payer: Self-pay | Admitting: Ophthalmology

## 2024-10-29 VITALS — BP 144/75 | HR 64

## 2024-10-29 DIAGNOSIS — H353112 Nonexudative age-related macular degeneration, right eye, intermediate dry stage: Secondary | ICD-10-CM

## 2024-10-29 DIAGNOSIS — Z794 Long term (current) use of insulin: Secondary | ICD-10-CM

## 2024-10-29 DIAGNOSIS — E119 Type 2 diabetes mellitus without complications: Secondary | ICD-10-CM

## 2024-10-29 DIAGNOSIS — H35033 Hypertensive retinopathy, bilateral: Secondary | ICD-10-CM

## 2024-10-29 DIAGNOSIS — H25813 Combined forms of age-related cataract, bilateral: Secondary | ICD-10-CM | POA: Diagnosis not present

## 2024-10-29 DIAGNOSIS — Z7984 Long term (current) use of oral hypoglycemic drugs: Secondary | ICD-10-CM

## 2024-10-29 DIAGNOSIS — I10 Essential (primary) hypertension: Secondary | ICD-10-CM

## 2024-10-29 DIAGNOSIS — H3581 Retinal edema: Secondary | ICD-10-CM

## 2024-10-29 DIAGNOSIS — H353221 Exudative age-related macular degeneration, left eye, with active choroidal neovascularization: Secondary | ICD-10-CM

## 2024-10-29 MED ORDER — BEVACIZUMAB CHEMO INJECTION 1.25MG/0.05ML SYRINGE FOR KALEIDOSCOPE
1.2500 mg | INTRAVITREAL | Status: AC | PRN
  Administered 2024-10-29: 21:00:00 1.25 mg via INTRAVITREAL

## 2024-11-03 ENCOUNTER — Other Ambulatory Visit (INDEPENDENT_AMBULATORY_CARE_PROVIDER_SITE_OTHER): Payer: Self-pay

## 2024-11-11 LAB — LIPID PANEL
Chol/HDL Ratio: 3.5 ratio (ref 0.0–5.0)
Cholesterol, Total: 147 mg/dL (ref 100–199)
HDL: 42 mg/dL
LDL Chol Calc (NIH): 78 mg/dL (ref 0–99)
Triglycerides: 158 mg/dL — ABNORMAL HIGH (ref 0–149)
VLDL Cholesterol Cal: 27 mg/dL (ref 5–40)

## 2024-11-17 ENCOUNTER — Other Ambulatory Visit

## 2024-11-17 ENCOUNTER — Ambulatory Visit: Admitting: "Endocrinology

## 2024-11-17 ENCOUNTER — Encounter: Payer: Self-pay | Admitting: "Endocrinology

## 2024-11-17 VITALS — BP 122/70 | HR 67 | Ht 67.0 in | Wt 186.0 lb

## 2024-11-17 DIAGNOSIS — E1165 Type 2 diabetes mellitus with hyperglycemia: Secondary | ICD-10-CM | POA: Diagnosis not present

## 2024-11-17 DIAGNOSIS — Z7984 Long term (current) use of oral hypoglycemic drugs: Secondary | ICD-10-CM

## 2024-11-17 DIAGNOSIS — E782 Mixed hyperlipidemia: Secondary | ICD-10-CM

## 2024-11-17 DIAGNOSIS — Z794 Long term (current) use of insulin: Secondary | ICD-10-CM | POA: Diagnosis not present

## 2024-11-17 LAB — POCT GLYCOSYLATED HEMOGLOBIN (HGB A1C): Hemoglobin A1C: 6.4 % — AB (ref 4.0–5.6)

## 2024-11-17 MED ORDER — INSULIN LISPRO (1 UNIT DIAL) 100 UNIT/ML (KWIKPEN): 1.0000 [IU] | PEN_INJECTOR | SUBCUTANEOUS | 11 refills | Status: AC | PRN

## 2024-11-17 NOTE — Patient Instructions (Addendum)
 Will recommend the following: Metformin  XR 500mg  2 pills bid Glipizide 10 mg bid Lantus 20-22 units qam Humalog  Correction scale: Use in addition to your meal time/short acting insulin  based on blood sugars as follows:  151 - 175: 1 unit 176 - 200: 2 units 201 - 225: 3 units 226 - 250: 4 units 251 - 275: 5 units 276 - 300: 6 units 301 - 325: 7 units 326 - 350: 8 units 351 - 375: 9 units 376 - 400: 10 units   ____________    Goals of DM therapy:  Morning Fasting blood sugar: 80-140  Blood sugar before meals: 80-140 Bed time blood sugar: 100-150  A1C <7%, limited only by hypoglycemia  1.Diabetes medications and their side effects discussed, including hypoglycemia    2. Check blood glucose:  a) Always check blood sugars before driving. Please see below (under hypoglycemia) on how to manage b) Check a minimum of 3 times/day or more as needed when having symptoms of hypoglycemia.   c) Try to check blood glucose before sleeping/in the middle of the night to ensure that it is remaining stable and not dropping less than 100 d) Check blood glucose more often if sick  3. Diet: a) 3 meals per day schedule b: Restrict carbs to 60-70 grams (4 servings) per meal c) Colorful vegetables - 3 servings a day, and low sugar fruit 2 servings/day Plate control method: 1/4 plate protein, 1/4 starch, 1/2 green, yellow, or red vegetables d) Avoid carbohydrate snacks unless hypoglycemic episode, or increased physical activity  4. Regular exercise as tolerated, preferably 3 or more hours a week  5. Hypoglycemia: a)  Do not drive or operate machinery without first testing blood glucose to assure it is over 90 mg%, or if dizzy, lightheaded, not feeling normal, etc, or  if foot or leg is numb or weak. b)  If blood glucose less than 70, take four 5gm Glucose tabs or 15-30 gm Glucose gel.  Repeat every 15 min as needed until blood sugar is >100 mg/dl. If hypoglycemia persists then call 911.   6.  Sick day management: a) Check blood glucose more often b) Continue usual therapy if blood sugars are elevated.   7. Contact the doctor immediately if blood glucose is frequently <60 mg/dl, or an episode of severe hypoglycemia occurs (where someone had to give you glucose/  glucagon  or if you passed out from a low blood glucose), or if blood glucose is persistently >350 mg/dl, for further management  8. A change in level of physical activity or exercise and a change in diet may also affect your blood sugar. Check blood sugars more often and call if needed.  Instructions: 1. Bring glucose meter, blood glucose records on every visit for review 2. Continue to follow up with primary care physician and other providers for medical care 3. Yearly eye  and foot exam 4. Please get blood work done prior to the next appointment

## 2024-11-17 NOTE — Progress Notes (Signed)
 "   Outpatient Endocrinology Note Joseph Birmingham, MD  11/17/2024   Joseph Chase [facility] 1955-11-17 [id number]  Referring Provider: Trudy Vaughn FALCON, MD Primary Care Provider: Trudy Vaughn FALCON, MD Reason for consultation: Subjective   Assessment & Plan  Diagnoses and all orders for this visit:  Uncontrolled type 2 diabetes mellitus with hyperglycemia (HCC) -     POCT glycosylated hemoglobin (Hb A1C) -     Microalbumin / creatinine urine ratio  Long term (current) use of oral hypoglycemic drugs  Long-term insulin  use (HCC)  Insulin  dose changed (HCC)  Mixed hypercholesterolemia and hypertriglyceridemia  Other orders -     insulin  lispro (HUMALOG  KWIKPEN) 100 UNIT/ML KwikPen; Inject 1-10 Units into the skin as needed (blood sugar >200). Max dose 30 units/day    Diabetes Type II complicated by hyperglycemia, No results found for: GFR Hba1c goal less than 7, current Hba1c is 71% around 09/14/2024   Lab Results  Component Value Date   HGBA1C 6.4 (A) 11/17/2024   Will recommend the following: Metformin  XR 500mg  2 pills bid Glipizide 10 mg bid (max dose 2 pills bid) Lantus 20-22 units qam Humalog  Correction scale: Use in addition to your meal time/short acting insulin  based on blood sugars as follows:  151 - 175: 1 unit 176 - 200: 2 units 201 - 225: 3 units 226 - 250: 4 units 251 - 275: 5 units 276 - 300: 6 units 301 - 325: 7 units 326 - 350: 8 units 351 - 375: 9 units 376 - 400: 10 units   No known contraindications/side effects to any of above medications Glucagon  discussed and prescribed with refills on 09/24/24   06/11/24: GFR 60 AST 11 ALT 15 A1C 7.8% Hb 14.2  -Last LD and Tg are as follows: Lab Results  Component Value Date   LDLCALC 78 11/10/2024    Lab Results  Component Value Date   TRIG 158 (H) 11/10/2024   -On rosuvastatin  10 mg QD -Follow low fat diet and exercise   -Blood pressure goal <140/90 - Microalbumin/creatinine goal is <  30 -Last MA/Cr is as follows: Lab Results  Component Value Date   MICROALBUR 55.6 09/24/2024   -on ACE/ARB losartan  100mg  every day  -diet changes including salt restriction -limit eating outside -counseled BP targets per standards of diabetes care -uncontrolled blood pressure can lead to retinopathy, nephropathy and cardiovascular and atherosclerotic heart disease  Reviewed and counseled on: -A1C target -Blood sugar targets -Complications of uncontrolled diabetes  -Checking blood sugar before meals and bedtime and bring log next visit -All medications with mechanism of action and side effects -Hypoglycemia management: rule of 15's, Glucagon  Emergency Kit and medical alert ID -low-carb low-fat plate-method diet -At least 20 minutes of physical activity per day -Annual dilated retinal eye exam and foot exam -compliance and follow up needs -follow up as scheduled or earlier if problem gets worse  Call if blood sugar is less than 70 or consistently above 250    Take a 15 gm snack of carbohydrate at bedtime before you go to sleep if your blood sugar is less than 100.    If you are going to fast after midnight for a test or procedure, ask your physician for instructions on how to reduce/decrease your insulin  dose.    Call if blood sugar is less than 70 or consistently above 250  -Treating a low sugar by rule of 15  (15 gms of sugar every 15 min until sugar is more than 70)  If you feel your sugar is low, test your sugar to be sure If your sugar is low (less than 70), then take 15 grams of a fast acting Carbohydrate (3-4 glucose tablets or glucose gel or 4 ounces of juice or regular soda) Recheck your sugar 15 min after treating low to make sure it is more than 70 If sugar is still less than 70, treat again with 15 grams of carbohydrate          Don't drive the hour of hypoglycemia  If unconscious/unable to eat or drink by mouth, use glucagon  injection or nasal spray baqsimi  and call  911. Can repeat again in 15 min if still unconscious.  Return in about 3 months (around 02/15/2025) for visit, labs today.   I have reviewed current medications, nurse's notes, allergies, vital signs, past medical and surgical history, family medical history, and social history for this encounter. Counseled patient on symptoms, examination findings, lab findings, imaging results, treatment decisions and monitoring and prognosis. The patient 69 understood the recommendations and agrees with the treatment plan. All questions regarding treatment plan were fully answered.  Joseph Birmingham, MD  11/17/2024    History of Present Illness Joseph Chase is a 69 y.o. year old male who presents for follow up of Type II diabetes mellitus.  Joseph Chase [facility] was first diagnosed in 2014.   Diabetes education -  Home diabetes regimen: Metformin  XR 500mg  twice day  Glipizide 10 mg bid Lantus 24 units qam  COMPLICATIONS -  MI/Stroke -  retinopathy -  neuropathy -  nephropathy  SYMPTOMS REVIEWED - Polyuria - Unintentional weight loss - Blurred vision  BLOOD SUGAR DATA CGM interpretation: At today's visit, we reviewed her CGM downloads. The full report is scanned in the media. Reviewing the CGM trends, BG are high between 8am-2pm, and improve thereafter.   Physical Exam  BP 122/70   Pulse 67   Ht 5' 7 (1.702 m)   Wt 186 lb (84.4 kg)   SpO2 95%   BMI 29.13 kg/m    Constitutional: well developed, well nourished Head: normocephalic, atraumatic Eyes: sclera anicteric, no redness Neck: supple Lungs: normal respiratory effort Neurology: alert and oriented Skin: dry, no appreciable rashes Musculoskeletal: no appreciable defects Psychiatric: normal mood and affect Diabetic Foot Exam - Simple   No data filed      Current Medications Patient's Medications  New Prescriptions   No medications on file  Previous Medications   ACETAMINOPHEN  (TYLENOL ) 500 MG TABLET    Take 2 tablets (1,000  mg total) by mouth every 6 (six) hours as needed for mild pain.   AMLODIPINE  (NORVASC ) 10 MG TABLET    take 1 tablet (10 milligram total) by mouth daily.   AZATHIOPRINE  (IMURAN ) 50 MG TABLET    Take 200 mg by mouth daily.   CHLORTHALIDONE  (HYGROTON ) 25 MG TABLET    Take 1 tablet (25 mg total) by mouth daily.   COENZYME Q10 (CO Q 10) 100 MG CAPS    Take 1 capsule by mouth daily.   EPINEPHRINE  (EPIPEN  2-PAK) 0.3 MG/0.3 ML IJ SOAJ INJECTION    Inject 0.3 mg into the muscle daily as needed. For severe life-threatening allergic reaction   FEXOFENADINE (ALLEGRA) 180 MG TABLET    Take 180 mg by mouth daily.   GABAPENTIN  (NEURONTIN ) 100 MG CAPSULE    Take 200 mg by mouth at bedtime.   GLIPIZIDE (GLUCOTROL) 5 MG TABLET    Take 5 mg by mouth 2 (two)  times daily.   GLUCAGON  (BAQSIMI  ONE PACK) 3 MG/DOSE POWD    Place 1 Device into the nose as needed (Low blood sugar with impaired consciousness).   LANTUS SOLOSTAR 100 UNIT/ML SOLOSTAR PEN    Inject 24 Units into the skin daily.   LOSARTAN  (COZAAR ) 100 MG TABLET    Take 100 mg by mouth daily.   METFORMIN  (GLUCOPHAGE -XR) 500 MG 24 HR TABLET    Take 2 tablets (1,000 mg total) by mouth 2 (two) times daily with a meal.   METOPROLOL  SUCCINATE (TOPROL -XL) 25 MG 24 HR TABLET    Take 75 mg by mouth daily.   MULTIPLE VITAMIN (THERA) TABS    Take 1 tablet by mouth daily.   ROSUVASTATIN  (CRESTOR ) 10 MG TABLET    Take 10 mg by mouth daily.   SILDENAFIL  (REVATIO ) 20 MG TABLET    Take 3-5 tablets 30 minutes before intercourse.   ZOLPIDEM  (AMBIEN ) 10 MG TABLET    Take 10 mg by mouth at bedtime.  Modified Medications   Modified Medication Previous Medication   INSULIN  LISPRO (HUMALOG  KWIKPEN) 100 UNIT/ML KWIKPEN insulin  lispro (HUMALOG  KWIKPEN) 100 UNIT/ML KwikPen      Inject 1-10 Units into the skin as needed (blood sugar >200). Max dose 30 units/day    Inject 1-10 Units into the skin as needed (blood sugar >200).  Discontinued Medications   No medications on file     Allergies Allergies  Allergen Reactions   Venomil Mixed Vespid [Mixed Vespid Venom]    Venomil Wasp Venom [Wasp Venom]    Penicillins Rash    Past Medical History Past Medical History:  Diagnosis Date   Borderline diabetes    Diabetes mellitus without complication (HCC)    Elevated cholesterol    Hypertension    Ulcerative colitis [facility])     Past Surgical History Past Surgical History:  Procedure Laterality Date   BREAST MASS EXCISION Right October 12,2016   Per patient, non malignant   LAPAROSCOPIC APPENDECTOMY N/A 10/12/2022   Procedure: APPENDECTOMY LAPAROSCOPIC;  Surgeon: Rubin Calamity, MD;  Location: [facility];  Service: General;  Laterality: N/A;   MEDIAL PARTIAL KNEE REPLACEMENT Right 6 yrs ago    Family History family history includes Alzheimer's disease in his mother; Heart attack in his father; Stroke in his father.  Social History Social History   Socioeconomic History   Marital status: Widowed    Spouse name: Not on file   Number of children: Not on file   Years of education: Not on file   Highest education level: Not on file  Occupational History   Not on file  Tobacco Use   Smoking status: Never   Smokeless tobacco: Never  Substance and Sexual Activity   Alcohol use: Never    Alcohol/week: 0.0 standard drinks of alcohol   Drug use: Never   Sexual activity: Not on file  Other Topics Concern   Not on file  Social History Narrative   Not on file   Social Drivers of Health   Tobacco Use: Low Risk (11/17/2024)   Patient History    Smoking Tobacco Use: Never    Smokeless Tobacco Use: Never    Passive Exposure: Not on file  Financial Resource Strain: Patient Declined (02/25/2024)   Received from [facility]   Overall Financial Resource Strain (CARDIA)    Difficulty of Paying Living Expenses: Patient declined  Food Insecurity: Patient Declined (02/25/2024)   Received from [facility]    Within  the past 12 months, you worried that  your food would run out before you got the money to buy more.: Patient declined    Within the past 12 months, the food you bought just didn't last and you didn't have money to get more.: Patient declined  Transportation Needs: Patient Declined (02/25/2024)   Received from [facility] - Transportation    Lack of Transportation (Medical): Patient declined    Lack of Transportation (Non-Medical): Patient declined  Physical Activity: Inactive (02/25/2024)   Received from [facility]   Exercise Vital Sign    On average, how many days per week do you engage in moderate to strenuous exercise (like a brisk walk)?: 0 days    On average, how many minutes do you engage in exercise at this level?: 20 min  Stress: Patient Declined (02/25/2024)   Received from [facility] - Occupational Stress Questionnaire    Feeling of Stress : Patient declined  Social Connections: Patient Declined (02/25/2024)   Received from [facility]   Social Network    How would you rate your social network (family, work, friends)?: Patient declined  Intimate Partner Violence: Patient Declined (02/25/2024)   Received from [facility]   HITS    Over the last 12 months how often did your partner physically hurt you?: Patient declined    Over the last 12 months how often did your partner insult you or talk down to you?: Patient declined    Over the last 12 months how often did your partner threaten you with physical harm?: Patient declined    Over the last 12 months how often did your partner scream or curse at you?: Patient declined  Depression (PHQ2-9): Not on file  Alcohol Screen: Not on file  Housing: Patient Declined (02/25/2024)   Received from [facility]    In the last 12 months, was there a time when you were not able to pay the mortgage or rent on time?: Patient declined    Number of Times Moved in the Last Year: Not on file    At any time in the  past 12 months, were you homeless or living in a shelter (including now)?: Patient declined  Utilities: Patient Declined (02/25/2024)   Received from [facility] Utilities    Threatened with loss of utilities: Patient declined  Health Literacy: Not on file    Lab Results  Component Value Date   HGBA1C 6.4 (A) 11/17/2024   Lab Results  Component Value Date   CHOL 147 11/10/2024   Lab Results  Component Value Date   HDL 42 11/10/2024   Lab Results  Component Value Date   LDLCALC 78 11/10/2024   Lab Results  Component Value Date   TRIG 158 (H) 11/10/2024   Lab Results  Component Value Date   CHOLHDL 3.5 11/10/2024   Lab Results  Component Value Date   CREATININE 1.22 04/24/2023   No results found for: GFR Lab Results  Component Value Date   MICROALBUR 55.6 09/24/2024      Component Value Date/Time   NA 138 04/24/2023 1542   K 3.9 04/24/2023 1542   CL 97 04/24/2023 1542   CO2 28 04/24/2023 1542   GLUCOSE 164 (H) 04/24/2023 1542   GLUCOSE 105 (H) 10/11/2022 2220   BUN 20 04/24/2023 1542   CREATININE 1.22 04/24/2023 1542   CALCIUM  10.0 04/24/2023 1542   GFRNONAA >  60 10/11/2022 2220   GFRAA (L) 03/23/2011 1030    49        The eGFR has been calculated using the MDRD equation. This calculation has not been validated in all clinical situations. eGFR's persistently <60 mL/min signify possible Chronic Kidney Disease.      Latest Ref Rng & Units 04/24/2023    3:42 PM 10/11/2022   10:20 PM 03/23/2011   10:30 AM  BMP  Glucose 70 - 99 mg/dL 835  894  878   BUN 8 - 27 mg/dL 20  19  21    Creatinine 0.76 - 1.27 mg/dL 8.77  8.76  8.23   BUN/Creat Ratio 10 - 24 16     Sodium 134 - 144 mmol/L 138  140  134   Potassium 3.5 - 5.2 mmol/L 3.9  3.7  4.1   Chloride 96 - 106 mmol/L 97  101  95   CO2 20 - 29 mmol/L 28  26  30    Calcium  8.6 - 10.2 mg/dL 89.9  9.4  9.4        Component Value Date/Time   WBC 9.3 10/11/2022 2220   RBC 4.84 10/11/2022 2220    HGB 15.1 10/11/2022 2220   HCT 43.2 10/11/2022 2220   PLT 186 10/11/2022 2220   MCV 89.3 10/11/2022 2220   MCH 31.2 10/11/2022 2220   MCHC 35.0 10/11/2022 2220   RDW 13.0 10/11/2022 2220   LYMPHSABS 1.6 10/11/2022 2220   MONOABS 0.8 10/11/2022 2220   EOSABS 0.2 10/11/2022 2220   BASOSABS 0.1 10/11/2022 2220     Parts of this note may have been dictated using voice recognition software. There may be variances in spelling and vocabulary which are unintentional. Not all errors are proofread. Please notify the dino if any discrepancies are noted or if the meaning of any statement is not clear.   "

## 2024-11-18 LAB — MICROALBUMIN / CREATININE URINE RATIO
Creatinine, Urine: 275 mg/dL (ref 20–320)
Microalb Creat Ratio: 360 mg/g{creat} — ABNORMAL HIGH
Microalb, Ur: 98.9 mg/dL

## 2024-11-18 NOTE — Progress Notes (Signed)
 " [facility] - Clinic Note  12/02/2024   CHIEF COMPLAINT Patient presents for Retina Follow Up  HISTORY OF PRESENT ILLNESS: Joseph Chase is a 69 y.o. male who presents to the clinic today for:  HPI     Retina Follow Up   Patient presents with  Wet AMD.  In both eyes.  This started 4 weeks ago.  Duration of 4 weeks.  Since onset it is stable.  I, the attending physician,  performed the HPI with the patient and updated documentation appropriately.        Comments   4 week retina follow up ARMD and IVA OS pt is reporting no vision changes noticed he denies any flashes or floaters       Last edited by Valdemar Rogue, MD on 12/02/2024 10:17 PM.     Patient states he can see a blob in the left eye--slightly improved  Referring physician: Willma Moats, OD [address] [place] [place],  [place] [address]  HISTORICAL INFORMATION:  Selected notes from the MEDICAL RECORD NUMBER Referred by Dr. Willma Moats, OD for possible exudative ARMD OS LEE:  Ocular Hx- PMH-   CURRENT MEDICATIONS: No current outpatient medications on file. (Ophthalmic Drugs)   No current facility-administered medications for this visit. (Ophthalmic Drugs)   Current Outpatient Medications (Other)  Medication Sig   acetaminophen  (TYLENOL ) 500 MG tablet Take 2 tablets (1,000 mg total) by mouth every 6 (six) hours as needed for mild pain.   amLODipine  (NORVASC ) 10 MG tablet take 1 tablet (10 milligram total) by mouth daily.   azaTHIOprine  (IMURAN ) 50 MG tablet Take 200 mg by mouth daily.   chlorthalidone  (HYGROTON ) 25 MG tablet Take 1 tablet (25 mg total) by mouth daily.   Coenzyme Q10 (CO Q 10) 100 MG CAPS Take 1 capsule by mouth daily.   EPINEPHrine  (EPIPEN  2-PAK) 0.3 mg/0.3 mL IJ SOAJ injection Inject 0.3 mg into the muscle daily as needed. For severe life-threatening allergic reaction   fexofenadine (ALLEGRA) 180 MG tablet Take 180 mg by mouth daily.   gabapentin  (NEURONTIN ) 100 MG  capsule Take 200 mg by mouth at bedtime.   glipiZIDE (GLUCOTROL) 5 MG tablet Take 5 mg by mouth 2 (two) times daily. (Patient taking differently: Take 10 mg by mouth 2 (two) times daily.)   Glucagon  (BAQSIMI  ONE PACK) 3 MG/DOSE POWD Place 1 Device into the nose as needed (Low blood sugar with impaired consciousness).   insulin  lispro (HUMALOG  KWIKPEN) 100 UNIT/ML KwikPen Inject 1-10 Units into the skin as needed (blood sugar >200). Max dose 30 units/day   LANTUS SOLOSTAR 100 UNIT/ML Solostar Pen Inject 24 Units into the skin daily.   losartan  (COZAAR ) 100 MG tablet Take 100 mg by mouth daily.   metFORMIN  (GLUCOPHAGE -XR) 500 MG 24 hr tablet Take 2 tablets (1,000 mg total) by mouth 2 (two) times daily with a meal.   metoprolol  succinate (TOPROL -XL) 25 MG 24 hr tablet Take 75 mg by mouth daily.   Multiple Vitamin (THERA) TABS Take 1 tablet by mouth daily.   rosuvastatin  (CRESTOR ) 10 MG tablet Take 10 mg by mouth daily.   sildenafil  (REVATIO ) 20 MG tablet Take 3-5 tablets 30 minutes before intercourse.   zolpidem  (AMBIEN ) 10 MG tablet Take 10 mg by mouth at bedtime.   No current facility-administered medications for this visit. (Other)   REVIEW OF SYSTEMS: ROS   Positive for: Endocrine, Cardiovascular Negative for: Eyes Last edited by Resa Delon ORN,  COT on 12/02/2024 12:55 PM.      ALLERGIES Allergies[1] PAST MEDICAL HISTORY Past Medical History:  Diagnosis Date   Borderline diabetes    Diabetes mellitus without complication (HCC)    Elevated cholesterol    Hypertension    Ulcerative colitis [facility])    Past Surgical History:  Procedure Laterality Date   BREAST MASS EXCISION Right October 12,2016   Per patient, non malignant   LAPAROSCOPIC APPENDECTOMY N/A 10/12/2022   Procedure: APPENDECTOMY LAPAROSCOPIC;  Surgeon: Rubin Calamity, MD;  Location: [facility] OR;  Service: General;  Laterality: N/A;   MEDIAL PARTIAL KNEE REPLACEMENT Right 6 yrs ago   FAMILY HISTORY Family History   Problem Relation Age of Onset   Alzheimer's disease Mother    Heart attack Father    Stroke Father    Allergic rhinitis Neg Hx    Angioedema Neg Hx    Asthma Neg Hx    Eczema Neg Hx    Immunodeficiency Neg Hx    Urticaria Neg Hx    SOCIAL HISTORY Social History[2]     OPHTHALMIC EXAM:  Base Eye Exam     Visual Acuity (Snellen - Linear)       Right Left   Dist [place] 20/20 -2 20/40 -2   Dist ph [place]  20/30 -1         Tonometry (Tonopen, 1:00 PM)       Right Left   Pressure 20 20         Pupils       Pupils Dark Light Shape React APD   Right PERRL 3 2 Round Brisk None   Left PERRL 3 2 Round Brisk None         Visual Fields       Left Right    Full Full         Extraocular Movement       Right Left    Full, Ortho Full, Ortho         Neuro/Psych     Oriented x3: Yes   Mood/Affect: Normal         Dilation     Both eyes: 2.5% Phenylephrine  @ 1:00 PM           Slit Lamp and Fundus Exam     External Exam       Right Left   External Normal Normal         Slit Lamp Exam       Right Left   Lids/Lashes Dermatochalasis - upper lid Dermatochalasis - upper lid   Conjunctiva/Sclera Nasal and temporal Pinguecula Nasal and temporal Pinguecula   Cornea 1+ Punctate epithelial erosions, Debris in tear film 1+ Punctate epithelial erosions, Debris in tear film   Anterior Chamber Deep and clear Deep and clear   Iris Round and dilated, No NVI Round and dilated, No NVI   Lens 2+ Nuclear sclerosis, 2+ Cortical cataract 2-3+ Nuclear sclerosis, 2-3+ Cortical cataract   Anterior Vitreous Vitreous syneresis Vitreous syneresis         Fundus Exam       Right Left   Disc Pink and sharp Pink and sharp, Compact   C/D Ratio 0.3 0.4   Macula Flat, Good foveal reflex, focal drusen and RPE mottling IT to fovea, No heme Flat, Blunted foveal reflex, RPE mottling, Drusen, punctate  CNV w/ edema inferior fovea--improved, no frank heme   Vessels Tortuous,  Vascular attenuation Tortuous, Vascular attenuation   Periphery Attached, No heme  Attached, No heme           IMAGING AND PROCEDURES  Imaging and Procedures for 12/02/2024  OCT, Retina - OU - Both Eyes       Right Eye Quality was good. Central Foveal Thickness: 294. Progression has been stable. Findings include normal foveal contour, no IRF, no SRF, retinal drusen , vitreomacular adhesion (Focal patch of drusen and ellipsoid thinning IT mac).   Left Eye Quality was good. Central Foveal Thickness: 317. Progression has improved. Findings include no SRF, abnormal foveal contour, retinal drusen , subretinal hyper-reflective material, intraretinal hyper-reflective material, pigment epithelial detachment (Focal CNV with surrounding IRF/edema inferior fovea--improved).   Notes *Images captured and stored on drive  Diagnosis / Impression:  OD: nonexudative ARMD: Focal patch of drusen and ellipsoid thinning IT mac, no DME OS: exudative ARMD: Focal CNV with surrounding IRF/edema inferior fovea--improved, no DME  Clinical management:  See below  Abbreviations: NFP - Normal foveal profile. CME - cystoid macular edema. PED - pigment epithelial detachment. IRF - intraretinal fluid. SRF - subretinal fluid. EZ - ellipsoid zone. ERM - epiretinal membrane. ORA - outer retinal atrophy. ORT - outer retinal tubulation. SRHM - subretinal hyper-reflective material. IRHM - intraretinal hyper-reflective material      Intravitreal Injection, Pharmacologic Agent - OS - Left Eye       Time Out 12/02/2024. 1:36 PM. Confirmed correct patient, procedure, site, and patient consented.   Anesthesia Topical anesthesia was used. Anesthetic medications included Lidocaine  2%, Proparacaine 0.5%.   Procedure Preparation included 5% betadine to ocular surface, eyelid speculum. A (32g) needle was used.   Injection: 1.25 mg Bevacizumab  1.25mg /0.46ml   Route: Intravitreal, Site: Left Eye   NDC: [id number],  Lot: [id number], Expiration date: 01/29/2025   Post-op Post injection exam found visual acuity of at least counting fingers. The patient tolerated the procedure well. There were no complications. The patient received written and verbal post procedure care education.            ASSESSMENT/PLAN:   ICD-10-CM   1. Exudative age-related macular degeneration of left eye with active choroidal neovascularization (HCC)  H35.3221 OCT, Retina - OU - Both Eyes    Intravitreal Injection, Pharmacologic Agent - OS - Left Eye    Bevacizumab  (AVASTIN ) SOLN 1.25 mg    2. 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 Diabetes mellitus without complication (HCC)  E11.9     4. Diabetes mellitus treated with oral medication (HCC)  E11.9    Z79.84     5. Encounter for long-term (current) use of insulin  (HCC)  Z79.4     6. Essential (primary) hypertension  I10     7. Hypertensive retinopathy of both eyes  H35.033     8. Combined forms of age-related cataract of both eyes  H25.813       Exudative age related macular degeneration, OS - s/p IVA OS #1 (12.17.25) - OCT OS: Focal CNV with surrounding IRF/edema inferior fovea--improved, no DME at 4.9 weeks - FA (12.17.25) shows OD: Mild focal staining IT to fovea; OS: Mild late leakage peri fovea greatest inferior to fovea-- +CNV  - recommend IVA OS #2 today, 01.20.26 w/ f/u in 4 weeks - pt wishes to be treated with IVA OS - RBA of procedure discussed, questions answered - informed consent obtained and signed 12.17.25 - see procedure note  - f/u in 4 wks -- DFE/OCT, possible injection   2. Age related macular  degeneration, non-exudative, OD  - OCT OD: Focal patch of drusen and ellipsoid thinning IT mac - The incidence, anatomy, and pathology of dry AMD, risk of progression, and the AREDS and AREDS 2 studies including smoking risks discussed with patient.  - Recommend amsler grid monitoring  - f/u 4 weeks  - DFE, OCT   3-5. Diabetes mellitus, type 2 without retinopathy  - A1C 7.5 (12.17.25) - The incidence, risk factors for progression, natural history and treatment options for diabetic retinopathy  were discussed with patient.   - The need for close monitoring of blood glucose, blood pressure, and serum lipids, avoiding cigarette or any type of tobacco, and the need for long term follow up was also discussed with patient. - f/u in 1 year, sooner prn   6,7. Hypertensive retinopathy OU - discussed importance of tight BP control - monitor   8. Mixed Cataract OU - The symptoms of cataract, surgical options, and treatments and risks were discussed with patient. - discussed diagnosis and progression - monitor   Ophthalmic Meds Ordered this visit:  Meds ordered this encounter  Medications   Bevacizumab  (AVASTIN ) SOLN 1.25 mg     Return in about 4 weeks (around 12/30/2024) for exu ARMD OS, DFE, OCT, likely IVA OS.  There are no Patient Instructions on file for this visit.  Explained the diagnoses, plan, and follow up with the patient and they expressed understanding.  Patient expressed understanding of the importance of proper follow up care.    This document serves as a record of services personally performed by Redell JUDITHANN Hans, MD, PhD. It was created on their behalf by Wanda GEANNIE Keens, COT an ophthalmic technician. The creation of this record is the provider's dictation and/or activities during the visit.    Electronically signed by:  Wanda GEANNIE Keens, COT  12/02/24 10:19 PM   This document serves as a record of services personally performed by Redell JUDITHANN Hans, MD, PhD. It was created on their behalf by Almetta Pesa, an ophthalmic technician. The creation of this record is the provider's dictation and/or activities during the visit.    Electronically signed by: Almetta Pesa, OA, 12/02/24  10:19 PM  Redell JUDITHANN Hans, M.D., Ph.D. Diseases & Surgery of the Retina and  Vitreous [facility] 12/02/2024  I have reviewed the above documentation for accuracy and completeness, and I agree with the above. Redell JUDITHANN Hans, M.D., Ph.D. 12/02/24 10:19 PM   Abbreviations: M myopia (nearsighted); A astigmatism; H hyperopia (farsighted); P presbyopia; Mrx spectacle prescription;  CTL contact lenses; OD right eye; OS left eye; OU both eyes  XT exotropia; ET esotropia; PEK punctate epithelial keratitis; PEE punctate epithelial erosions; DES dry eye syndrome; MGD meibomian gland dysfunction; ATs artificial tears; PFAT's preservative free artificial tears; NSC nuclear sclerotic cataract; PSC posterior subcapsular cataract; ERM epi-retinal membrane; PVD posterior vitreous detachment; RD retinal detachment; DM diabetes mellitus; DR diabetic retinopathy; NPDR non-proliferative diabetic retinopathy; PDR proliferative diabetic retinopathy; CSME clinically significant macular edema; DME diabetic macular edema; dbh dot blot hemorrhages; CWS cotton wool spot; POAG primary open angle glaucoma; C/D cup-to-disc ratio; HVF humphrey visual field; GVF goldmann visual field; OCT optical coherence tomography; IOP intraocular pressure; BRVO Branch retinal vein occlusion; CRVO central retinal vein occlusion; CRAO central retinal artery occlusion; BRAO branch retinal artery occlusion; RT retinal tear; SB scleral buckle; PPV pars plana vitrectomy; VH Vitreous hemorrhage; PRP panretinal laser photocoagulation; IVK intravitreal kenalog ; VMT vitreomacular traction; MH Macular hole;  NVD neovascularization  of the disc; NVE neovascularization elsewhere; AREDS age related eye disease study; ARMD age related macular degeneration; POAG primary open angle glaucoma; EBMD epithelial/anterior basement membrane dystrophy; ACIOL anterior chamber intraocular lens; IOL intraocular lens; PCIOL posterior chamber intraocular lens; Phaco/IOL phacoemulsification with intraocular lens placement; PRK  photorefractive keratectomy; LASIK laser assisted in situ keratomileusis; HTN hypertension; DM diabetes mellitus; COPD chronic obstructive pulmonary disease      [1]  Allergies Allergen Reactions   Venomil Mixed Vespid [Mixed Vespid Venom]    Venomil Wasp Venom [Wasp Venom]    Penicillins Rash  [2]  Social History Tobacco Use   Smoking status: Never   Smokeless tobacco: Never  Substance Use Topics   Alcohol use: Never    Alcohol/week: 0.0 standard drinks of alcohol   Drug use: Never   "

## 2024-12-01 ENCOUNTER — Other Ambulatory Visit: Payer: Self-pay | Admitting: Cardiology

## 2024-12-02 ENCOUNTER — Ambulatory Visit (INDEPENDENT_AMBULATORY_CARE_PROVIDER_SITE_OTHER): Admitting: Ophthalmology

## 2024-12-02 ENCOUNTER — Encounter (INDEPENDENT_AMBULATORY_CARE_PROVIDER_SITE_OTHER): Payer: Self-pay | Admitting: Ophthalmology

## 2024-12-02 DIAGNOSIS — H353112 Nonexudative age-related macular degeneration, right eye, intermediate dry stage: Secondary | ICD-10-CM | POA: Diagnosis not present

## 2024-12-02 DIAGNOSIS — H25813 Combined forms of age-related cataract, bilateral: Secondary | ICD-10-CM | POA: Diagnosis not present

## 2024-12-02 DIAGNOSIS — I10 Essential (primary) hypertension: Secondary | ICD-10-CM

## 2024-12-02 DIAGNOSIS — Z7984 Long term (current) use of oral hypoglycemic drugs: Secondary | ICD-10-CM

## 2024-12-02 DIAGNOSIS — Z794 Long term (current) use of insulin: Secondary | ICD-10-CM

## 2024-12-02 DIAGNOSIS — H35033 Hypertensive retinopathy, bilateral: Secondary | ICD-10-CM

## 2024-12-02 DIAGNOSIS — H353221 Exudative age-related macular degeneration, left eye, with active choroidal neovascularization: Secondary | ICD-10-CM

## 2024-12-02 DIAGNOSIS — E119 Type 2 diabetes mellitus without complications: Secondary | ICD-10-CM | POA: Diagnosis not present

## 2024-12-02 MED ORDER — BEVACIZUMAB CHEMO INJECTION 1.25MG/0.05ML SYRINGE FOR KALEIDOSCOPE
1.2500 mg | INTRAVITREAL | Status: AC | PRN
  Administered 2024-12-02: 1.25 mg via INTRAVITREAL

## 2024-12-08 ENCOUNTER — Ambulatory Visit

## 2024-12-08 ENCOUNTER — Ambulatory Visit: Payer: Medicare Other | Admitting: Internal Medicine

## 2024-12-10 LAB — LAB REPORT - SCANNED
A1c: 6.8
EGFR: 56.9

## 2024-12-12 ENCOUNTER — Ambulatory Visit

## 2024-12-12 DIAGNOSIS — Z91038 Other insect allergy status: Secondary | ICD-10-CM

## 2024-12-18 ENCOUNTER — Ambulatory Visit: Admitting: Cardiology

## 2024-12-18 ENCOUNTER — Encounter: Payer: Self-pay | Admitting: Cardiology

## 2024-12-18 VITALS — BP 128/78 | HR 65 | Ht 69.0 in | Wt 185.6 lb

## 2024-12-18 DIAGNOSIS — I493 Ventricular premature depolarization: Secondary | ICD-10-CM

## 2024-12-18 DIAGNOSIS — Z79899 Other long term (current) drug therapy: Secondary | ICD-10-CM | POA: Diagnosis not present

## 2024-12-18 DIAGNOSIS — I10 Essential (primary) hypertension: Secondary | ICD-10-CM

## 2024-12-18 DIAGNOSIS — E782 Mixed hyperlipidemia: Secondary | ICD-10-CM

## 2024-12-18 NOTE — Progress Notes (Signed)
 "     Clinical Summary Joseph Chase is a 69 y.o.male seen today for follow up of the following medical problems.      1. Dizziness  - was orthostatic at a prior clinic visit around time of symptoms  -07/2021 7 day zio patch by pcp: min HR 43, Max HR 190, avg HR 78. Rare supraventricular ectopy, occaional PVCs 1.9% burden. One runs SVT 5 beats.    - denies any recent symptoms.      2. Chest pain - 12/2020 nuclear stress [facility]: no ischemia, large apical/inferior scar, LVEF 43% thus listed as intermediate risk - 12/2020 [facility] echo LVEF 50%, grade I dd, mild MR   - no recent symptoms.    3. Ulcerative colitis - he is on imuran  - followed by GI       4. HTN - compliant with meds   5. 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  Past Medical History:  Diagnosis Date   Borderline diabetes    Diabetes mellitus without complication (HCC)    Elevated cholesterol    Hypertension    Ulcerative colitis (HCC)      Allergies[1]   Current Outpatient Medications  Medication Sig Dispense Refill   acetaminophen  (TYLENOL ) 500 MG tablet Take 2 tablets (1,000 mg total) by mouth every 6 (six) hours as needed for mild pain. 30 tablet 0   amLODipine  (NORVASC ) 10 MG tablet take 1 tablet (10 milligram total) by mouth daily. 90 tablet 0   azaTHIOprine  (IMURAN ) 50 MG tablet Take 200 mg by mouth daily.     chlorthalidone  (HYGROTON ) 25 MG tablet Take 1 tablet (25 mg total) by mouth daily. 30 tablet 1   Coenzyme Q10 (CO Q 10) 100 MG CAPS Take 1 capsule by mouth daily.     EPINEPHrine  (EPIPEN  2-PAK) 0.3 mg/0.3 mL IJ SOAJ injection Inject 0.3 mg into the muscle daily as needed. For severe life-threatening allergic reaction 2 each 1   fexofenadine (ALLEGRA) 180 MG tablet Take 180 mg by mouth daily.     gabapentin  (NEURONTIN ) 100 MG capsule Take 200 mg by mouth at bedtime.      glipiZIDE (GLUCOTROL) 5 MG tablet Take 5 mg by mouth 2 (two) times daily. (Patient taking differently: Take 10 mg by mouth 2 (two) times daily.)     Glucagon  (BAQSIMI  ONE PACK) 3 MG/DOSE POWD Place 1 Device into the nose as needed (Low blood sugar with impaired consciousness). 2 each 3   insulin  lispro (HUMALOG  KWIKPEN) 100 UNIT/ML KwikPen Inject 1-10 Units into the skin as needed (blood sugar >200). Max dose 30 units/day 15 mL 11   LANTUS SOLOSTAR 100 UNIT/ML Solostar Pen Inject 24 Units into the skin daily.     losartan  (COZAAR ) 100 MG tablet Take 100 mg by mouth daily.     metFORMIN  (GLUCOPHAGE -XR) 500 MG 24 hr tablet Take 2 tablets (1,000 mg total) by mouth 2 (two) times daily with a meal. 120 tablet 5   metoprolol  succinate (TOPROL -XL) 25 MG 24 hr tablet Take 75 mg by mouth daily.     Multiple Vitamin (THERA) TABS Take 1 tablet by mouth daily.     rosuvastatin  (CRESTOR ) 10 MG tablet Take 10 mg by mouth daily.     sildenafil  (REVATIO ) 20 MG tablet Take 3-5 tablets 30 minutes before intercourse. 30 tablet 6  zolpidem  (AMBIEN ) 10 MG tablet Take 10 mg by mouth at bedtime.     No current facility-administered medications for this visit.     Past Surgical History:  Procedure Laterality Date   BREAST MASS EXCISION Right October 12,2016   Per patient, non malignant   LAPAROSCOPIC APPENDECTOMY N/A 10/12/2022   Procedure: APPENDECTOMY LAPAROSCOPIC;  Surgeon: Rubin Calamity, MD;  Location: [facility] OR;  Service: General;  Laterality: N/A;   MEDIAL PARTIAL KNEE REPLACEMENT Right 6 yrs ago     Allergies[2]    Family History  Problem Relation Age of Onset   Alzheimer's disease Mother    Heart attack Father    Stroke Father    Allergic rhinitis Neg Hx    Angioedema Neg Hx    Asthma Neg Hx    Eczema Neg Hx    Immunodeficiency Neg Hx    Urticaria Neg Hx      Social History Joseph Chase reports that he has never smoked. He has never used smokeless tobacco. Joseph Chase reports no  history of alcohol use.    Physical Examination Today's Vitals   12/18/24 1129  BP: 128/78  Pulse: 65  SpO2: 96%  Weight: 185 lb 9.6 oz (84.2 kg)  Height: 5' 9 (1.753 m)   Body mass index is 27.41 kg/m.  Gen: resting comfortably, no acute distress HEENT: no scleral icterus, pupils equal round and reactive, no palptable cervical adenopathy,  CV: RRR, no m/r,g no jvd Resp: Clear to auscultation bilaterally GI: abdomen is soft, non-tender, non-distended, normal bowel sounds, no hepatosplenomegaly MSK: extremities are warm, no edema.  Skin: warm, no rash Neuro:  no focal deficits Psych: appropriate affect   Diagnostic Studies 12/2019 [facility] Echo  Summary  1. The left ventricle is upper normal in size with normal wall thickness.  2. The left ventricular systolic function is borderline, LVEF is visually estimated at 50%.  3. There is grade I diastolic dysfunction (impaired relaxation).  4. There is mild mitral valve regurgitation.  5. The left atrium is mildly dilated in size.  6. The right ventricle is normal in size, with normal systolic function.   12/2019 nuclear stress 1. No reversible ischemia. Large scar involving the apex and  inferior wall..   2. Normal left ventricular wall motion. Mild LEFT ventricular  dilatation   3. Left ventricular ejection fraction 43%   4. Non invasive risk stratification*: intermediate (based on low  ejection fraction)         Assessment and Plan   1.HTN - bp is at goal, continue current meds   2. PVCs - noted by prior monitor, no recent symptoms. Continue metoprolol  at current dose - EKG shows NSR today   3. HLD - lipds at goal, continue current meds   F/u 1 year    Dorn PHEBE Ross, M.D.,     [1]  Allergies Allergen Reactions   Venomil Mixed Vespid [Mixed Vespid Venom]    Venomil Wasp Venom [Wasp Venom]    Penicillins Rash  [2]  Allergies Allergen Reactions   Venomil Mixed Vespid [Mixed Vespid  Venom]    Venomil Wasp Venom [Wasp Venom]    Penicillins Rash   "

## 2024-12-18 NOTE — Patient Instructions (Signed)

## 2024-12-19 ENCOUNTER — Ambulatory Visit

## 2024-12-19 DIAGNOSIS — Z91038 Other insect allergy status: Secondary | ICD-10-CM

## 2024-12-24 ENCOUNTER — Ambulatory Visit

## 2024-12-30 ENCOUNTER — Encounter (INDEPENDENT_AMBULATORY_CARE_PROVIDER_SITE_OTHER): Admitting: Ophthalmology

## 2024-12-30 DIAGNOSIS — Z794 Long term (current) use of insulin: Secondary | ICD-10-CM

## 2024-12-30 DIAGNOSIS — H353221 Exudative age-related macular degeneration, left eye, with active choroidal neovascularization: Secondary | ICD-10-CM

## 2024-12-30 DIAGNOSIS — H25813 Combined forms of age-related cataract, bilateral: Secondary | ICD-10-CM

## 2024-12-30 DIAGNOSIS — E119 Type 2 diabetes mellitus without complications: Secondary | ICD-10-CM

## 2024-12-30 DIAGNOSIS — H35033 Hypertensive retinopathy, bilateral: Secondary | ICD-10-CM

## 2024-12-30 DIAGNOSIS — H353112 Nonexudative age-related macular degeneration, right eye, intermediate dry stage: Secondary | ICD-10-CM

## 2024-12-30 DIAGNOSIS — I10 Essential (primary) hypertension: Secondary | ICD-10-CM

## 2025-01-12 ENCOUNTER — Ambulatory Visit: Admitting: Family Medicine

## 2025-02-16 ENCOUNTER — Ambulatory Visit: Admitting: "Endocrinology
